# Patient Record
Sex: Female | Born: 1970 | Race: Black or African American | Hispanic: No | Smoking: Never smoker
Health system: Southern US, Community
[De-identification: ages and names within clinical notes are randomized; demographics above are authoritative.]

## PROBLEM LIST (undated history)

## (undated) DIAGNOSIS — B009 Herpesviral infection, unspecified: Secondary | ICD-10-CM

## (undated) DIAGNOSIS — K529 Noninfective gastroenteritis and colitis, unspecified: Secondary | ICD-10-CM

## (undated) DIAGNOSIS — D259 Leiomyoma of uterus, unspecified: Secondary | ICD-10-CM

## (undated) HISTORY — PX: WISDOM TOOTH EXTRACTION: SHX21

## (undated) HISTORY — PX: ABDOMINAL HYSTERECTOMY: SHX81

---

## 1999-11-16 ENCOUNTER — Other Ambulatory Visit: Admission: RE | Admit: 1999-11-16 | Discharge: 1999-11-16 | Payer: Self-pay | Admitting: *Deleted

## 2000-02-08 ENCOUNTER — Encounter: Payer: Self-pay | Admitting: Family Medicine

## 2000-02-08 ENCOUNTER — Encounter: Admission: RE | Admit: 2000-02-08 | Discharge: 2000-02-08 | Payer: Self-pay | Admitting: Family Medicine

## 2000-09-05 ENCOUNTER — Other Ambulatory Visit: Admission: RE | Admit: 2000-09-05 | Discharge: 2000-09-05 | Payer: Self-pay | Admitting: *Deleted

## 2001-06-26 ENCOUNTER — Other Ambulatory Visit: Admission: RE | Admit: 2001-06-26 | Discharge: 2001-06-26 | Payer: Self-pay | Admitting: Obstetrics & Gynecology

## 2001-08-17 ENCOUNTER — Emergency Department (HOSPITAL_COMMUNITY): Admission: EM | Admit: 2001-08-17 | Discharge: 2001-08-17 | Payer: Self-pay | Admitting: Emergency Medicine

## 2001-08-17 ENCOUNTER — Encounter: Payer: Self-pay | Admitting: Emergency Medicine

## 2001-10-11 ENCOUNTER — Encounter (INDEPENDENT_AMBULATORY_CARE_PROVIDER_SITE_OTHER): Payer: Self-pay | Admitting: Specialist

## 2001-10-11 ENCOUNTER — Encounter: Payer: Self-pay | Admitting: Obstetrics and Gynecology

## 2001-10-11 ENCOUNTER — Inpatient Hospital Stay (HOSPITAL_COMMUNITY): Admission: AD | Admit: 2001-10-11 | Discharge: 2001-10-15 | Payer: Self-pay | Admitting: Obstetrics & Gynecology

## 2001-10-12 ENCOUNTER — Encounter: Payer: Self-pay | Admitting: Obstetrics and Gynecology

## 2001-10-16 ENCOUNTER — Encounter: Admission: RE | Admit: 2001-10-16 | Discharge: 2001-11-15 | Payer: Self-pay | Admitting: Obstetrics and Gynecology

## 2001-11-13 ENCOUNTER — Other Ambulatory Visit: Admission: RE | Admit: 2001-11-13 | Discharge: 2001-11-13 | Payer: Self-pay | Admitting: Obstetrics & Gynecology

## 2001-11-16 ENCOUNTER — Encounter: Admission: RE | Admit: 2001-11-16 | Discharge: 2001-12-16 | Payer: Self-pay | Admitting: Obstetrics and Gynecology

## 2002-01-16 ENCOUNTER — Encounter: Admission: RE | Admit: 2002-01-16 | Discharge: 2002-02-15 | Payer: Self-pay | Admitting: Obstetrics and Gynecology

## 2002-03-18 ENCOUNTER — Encounter: Admission: RE | Admit: 2002-03-18 | Discharge: 2002-04-17 | Payer: Self-pay | Admitting: Obstetrics and Gynecology

## 2002-11-18 ENCOUNTER — Other Ambulatory Visit: Admission: RE | Admit: 2002-11-18 | Discharge: 2002-11-18 | Payer: Self-pay | Admitting: Obstetrics & Gynecology

## 2005-02-19 HISTORY — PX: ROBOT ASSISTED MYOMECTOMY: SHX5142

## 2005-05-22 ENCOUNTER — Ambulatory Visit (HOSPITAL_COMMUNITY): Admission: RE | Admit: 2005-05-22 | Discharge: 2005-05-23 | Payer: Self-pay | Admitting: Obstetrics & Gynecology

## 2005-05-22 ENCOUNTER — Encounter (INDEPENDENT_AMBULATORY_CARE_PROVIDER_SITE_OTHER): Payer: Self-pay | Admitting: *Deleted

## 2010-02-04 ENCOUNTER — Inpatient Hospital Stay (HOSPITAL_COMMUNITY)
Admission: AD | Admit: 2010-02-04 | Discharge: 2010-02-04 | Payer: Self-pay | Source: Home / Self Care | Attending: Obstetrics & Gynecology | Admitting: Obstetrics & Gynecology

## 2010-05-01 LAB — URINALYSIS, ROUTINE W REFLEX MICROSCOPIC
Glucose, UA: NEGATIVE mg/dL
Ketones, ur: NEGATIVE mg/dL
Leukocytes, UA: NEGATIVE
Nitrite: NEGATIVE
Protein, ur: NEGATIVE mg/dL
pH: 5.5 (ref 5.0–8.0)

## 2010-05-01 LAB — CBC
HCT: 39.1 % (ref 36.0–46.0)
Hemoglobin: 13.3 g/dL (ref 12.0–15.0)
MCH: 29.4 pg (ref 26.0–34.0)
MCHC: 34 g/dL (ref 30.0–36.0)
MCV: 86.5 fL (ref 78.0–100.0)
Platelets: 295 10*3/uL (ref 150–400)
RBC: 4.52 MIL/uL (ref 3.87–5.11)
RDW: 13.7 % (ref 11.5–15.5)
WBC: 13.1 10*3/uL — ABNORMAL HIGH (ref 4.0–10.5)

## 2010-05-01 LAB — SAMPLE TO BLOOD BANK

## 2010-05-01 LAB — POCT PREGNANCY, URINE: Preg Test, Ur: POSITIVE

## 2010-05-01 LAB — HCG, QUANTITATIVE, PREGNANCY: hCG, Beta Chain, Quant, S: 6861 m[IU]/mL — ABNORMAL HIGH (ref <5)

## 2010-05-01 LAB — URINE MICROSCOPIC-ADD ON

## 2010-07-07 NOTE — Discharge Summary (Signed)
NAME:  Cheryl Grant, Cheryl Grant NO.:  [id number]   MEDICAL RECORD NO.:  [id number]                   PATIENT TYPE:   LOCATION:                                       FACILITY:   PHYSICIAN:  Maxie Better, MD              DATE OF BIRTH:   DATE OF ADMISSION:  10/11/2001  DATE OF DISCHARGE:  10/15/2001                                 DISCHARGE SUMMARY   ADMISSION DIAGNOSES:  1. Fever.  2. Fibroid uterus.  3. Intrauterine gestation at 28 weeks.   DISCHARGE DIAGNOSES:  1. Preterm gestation delivered.  2. Chorioamnionitis.  3. Preterm premature rupture of membranes.  4. Fibroid uterus.  5. Postoperative anemia.  6. Group B Streptococcus urinary tract infection.  7. Status post cesarean section.   HISTORY OF PRESENT ILLNESS:  This is a 40 year old, G1, P0, single, African-  American female with an EDC of January 03, 2002, by ultrasound who presents  at 83 weeks' gestation with a fever of unclear etiology.  On presentation,  the patient reported the fever started the night previously and had been  intermittent.  She reported dry cough.  She denies any diarrhea, nausea or  vomiting.  No family member is ill.  She had not traveled recently.  She  denied any neck pain or urinary symptoms.  She also denied any back pain.  She had a question of possible leakage of fluid, however, the patient  attributed this to the use of MetroGel for the diagnosis of bacterial  vaginosis.  The patient has reported good fetal movements and she had no  upper respiratory symptoms or arthralgia.  She also denied any photophobia.  She does note some shaking chills.  There was no evidence of any rash or  vaginal bleeding.  The patient has been receiving her care through Dr.  Seymour Bars and to date, the pregnancy had been unremarkable with the exception  of an abnormal one-hour glucose challenge test.  She is known to have  multiple fibroids with the largest on the right measuring 9 x  8 x 7.9 cm on  the last study.   HOSPITAL COURSE:  On presentation, the patient had a temperature of 102 and  decreased to 98.2 with Tylenol.  Her pulse was between 103 and 125.  Her  blood pressure was 122/73.  Her exam was notable for tachycardia.  Breasts  were soft with no palpable mass.  Abdomen was gravid with a palpable mass in  the right lower quadrant with no palpable contractions and the uterus was  nontender.  Her exam showed some small lesions x2, questioned condylomatous  changes.  The vagina had a mucoid slightly yellow discharge for which wet-  prep was performed.  The cervix was rounded off, closed, 2 cm long, vertex -  3 and she was firm negative.  There was no palpable inguinal or  supraclavicular nodes and extremities had  no edema.  She was placed on a  monitor and her initial baseline fetal heart rate was  165-170 that decreased to 150s after the 1 g of Tylenol.  She was  contracting every two to four minutes.  Urinalysis showed 40 of ketones,  specific gravity of 1.025, no leukocytes.  Blood cultures x1 were done.  Urine culture was sent.  Group B Streptococcus culture was done.  Gonorrhea  and Chlamydia cultures were obtained.  The patient underwent a chest x-ray  to rule out an atypical pneumonia and that was negative.  An ultrasound was  performed that showed normal amniotic fluid index and estimated fetal weight  of 1104 g, vertex at 27-5/7 weeks' gestation.  Her wet-prep showed positive  clue cells.  CBC showed a white count of 26.8 with 94 polys and 3 lymphs,  hematocrit of 35.3.   The patient was admitted and she was initially given IV ampicillin and  planned to add gentamicin.  However, with a working diagnosis of possible  viral/atypical pneumonia and for better coverage, she was changed to  ceftriaxone and azithromycin.  An infectious disease consult was obtained  and subcutaneous terbutaline x1 with a plan for magnesium sulfate as needed.  The patient was  continued on monitoring and hydration intravenously was  planned.  Amniocentesis was deferred.  The patient was seen by Dr. Ninetta Lights  from infectious disease.  Additional lab studies were recommended by him and  performed including TSH, ANA, Amylase, complete metabolic profile and  consideration to repeat her HIV serology.  She was started on magnesium  sulfate for continued contractions with a plan to await antibiotics response  which had just been started.  An ICU consultation was obtained.  The  patient, despite her contractions, remained by cervical exam unchanged.  Due  to the fact of ongoing fever and contractions, discussion was had regarding  amniocentesis at that point.  The procedure was explained and ultrasound was  called in for an amniocentesis to rule out chorioamnionitis.  While getting  ready for the amniocentesis, the patient reported something running out of  her vagina.  She had a Foley in place.  Blood tinged fluid was noted.  She  was nitrazine positive and fluid was seen leaking from the vaginal area.  Ultrasound also now reported decreased amniotic fluid index which is  consistent with a diagnosis of preterm premature rupture of membranes.  Clindamycin and gentamicin were ordered and the patient was informed of the  need to proceed with cesarean section in light of her cervix remaining  closed and clearly the source of her fever appears to be due to intrauterine  infection.  The patient was transferred to the operating room where she  underwent the primary cesarean section via the Kronig incision.  This  resulted in the delivery of a live female, Apgar's of 1, 5 and 5.  The  placenta was sent to pathology.  Intrauterine anaerobic and aerobic cultures  were performed.  Cord pH was 7.26.  Normal tubes and ovaries are identified.  There was a 9 cm right fundal anterior subserosal fibroid.  Multiple greater than 3 cm intramural posterior fibroids were seen.  There were some   adhesions to that right large fibroid that were lysed.  The patient was  continued on her gentamicin and clindamycin postoperatively.  Recommendations from infectious disease was to consolidate on antibiotics  and therefore the patient was switched to Unasyn.  A repeat CBC had shown a  white count of 29.4, hemoglobin 11.6 and hematocrit 34.2 on October 12, 2001.  Blood culture was negative.  Urine culture showed Group B Streptococcus.  TSH was normal and her complete metabolic profile was notable for  hyperkalemia of 3.3 and glucose of 136.  The uterine cultures were also  positive for Group B Streptococcus.  The patient defervesced by postop day  #1 and remained afebrile throughout her postoperative course.  Her CBC on  postop day #1 showed a white count of 26.6, hemoglobin 10.5, hematocrit  30.5, platelet count of 265,000.  By postop day #3, the Unasyn was  discontinued.  The patient was started on Augmentin 875 mg p.o. b.i.d. for a  plan for a 10-day course.  She was tolerating a regular diet and has had  flatus.  Her incision showed no erythema, induration and she had moderate  lochia.  The patient desired to go home.   DISPOSITION:  Discharged to home with the baby staying in the NICU due to  prematurity.   CONDITION ON DISCHARGE:  Stable.   DISCHARGE MEDICATIONS:  1. Tylox #30 one p.o. q.4h. p.r.n. pain.  2. Augmentin 875 mg p.o. b.i.d. x10 days.  3. Prenatal vitamins one p.o. q.d.  4. Over-the-counter iron supplementation one p.o. b.i.d.   FOLLOW UP:  Follow-up appointment in one week with Dr. Seymour Bars.  Postpartum  visit in four weeks.    SPECIAL INSTRUCTIONS:  Call for temperature of 100.4 or greater, severe  abdominal pain, nausea, vomiting, increased incisional pain, redness or  drainage from the incision site.  Nothing per vagina x6 weeks.   ACTIVITY:  No heavy lifting or driving x2 weeks.                                               Maxie Better, MD     [place]/MEDQ  D:  12/07/2001  T:  12/08/2001  Job:  [id number]

## 2010-07-07 NOTE — Op Note (Signed)
Cheryl Grant, Cheryl Grant                         ACCOUNT NO.:  [id number]   MEDICAL RECORD NO.:  [id number]                   PATIENT TYPE:  INP   LOCATION:  9121                                 FACILITY:  [facility]   PHYSICIAN:  Sheronette A. Cherly Hensen, M.D.         DATE OF BIRTH:  [birth year]   DATE OF PROCEDURE:  10/11/2001  DATE OF DISCHARGE:                                 OPERATIVE REPORT   PREOPERATIVE DIAGNOSES:  1. Preterm premature rupture of membranes presumed chorioamnionitis.  2. Intrauterine gestation at 28-1/7 weeks.  3. Fibroid uterus.   POSTOPERATIVE DIAGNOSES:  1. Preterm premature rupture of membranes.  2. Chorioamnionitis.  3. Intrauterine gestation at 28-1/7 weeks.  4. Large subserosal and internal fibroids.   PROCEDURE:  Primary cesarean section, Kroneg incision.   SURGEON:  Sheronette A. Cherly Hensen, M.D.   ASSISTANT:  Pershing Cox, M.D.   INDICATIONS:  This is a 40 year old gravida 1, para 0 female at 28-1/7  weeks' gestation by early ultrasound who presented on October 10, 2001, with  temperature of 102 without any clearly defined source.  The patient had been  treated most recently for a bacterial vaginosis and has completely her  therapy on Thursday.  On presentation, the patient reported having had some  lower abdominal pain that had resolved.  She has had intermittent dry cough  but denied any nausea, vomiting or diarrhea.  She also had denied any  urinary symptoms and thought that some leakage of fluid was really her  MetroGel from the vagina.  The patient also had no upper respiratory other  concerns.  Evaluation included an obstetrical ultrasound which showed a  normal amniotic fluid and estimated fetal weight of approximately 1100 g.  Chest x-ray was negative.  Her CBC was notable for a white count of 26.8  with a left shift.  Urinalysis was negative.  Blood cultures and urine  cultures were sent.  An infectious disease consultation was obtained,  and  the patient who initially was ordered to have ampicillin and gentamicin  received only ampicillin intravenously, and her antibiotic regimen was  changed to ceftriaxone and azithromycin with the possibility that this may  be atypical pneumonia which may manifest itself as a result of rehydration  with the patient.  She was given Tylenol for her fever.  The patient on  initial presentation had a fetal tachycardia as well as contractions which  was attributed to her underlying infection of which the source was not  clearly defined.  She is known to have multiple uterine fibroids, however,  her exam was nontender, and the fibroids in her right lower quadrant could  be palpated.  The patient was also noted to be fern negative.  Her wet prep  was noted to have positive clue cells but otherwise unremarkable and she was  nitrazine negative as well.  Her cervix was closed on presentation.  The  patient was continued on the  antibiotic regimen based on the agreement along  with the infectious disease that at this point the possible source of her  fever and await the medications to work.  The patient received one dose of  subcutaneous terbutaline and had her contractions abated.  However, several  hours later, those contractions resumed, and magnesium sulfate was started  in order to not mask any other symptoms that needed to be seen as well as  the patient's maternal pulse.  The magnesium was ultimately increased to 2.5  g per hour but the contractions though spaced were still persistent.  Her  temperature respiked and ultimately after consideration what was left for  evaluation, amniocentesis was ordered.  Gentamicin was ordered to be started  and be added to her current regimen.  Fetal heart rate increased to the  190s.  Ultrasound came in for the amniocentesis.  Consent was obtained for  that procedure and in the process of starting to prep for the amniocentesis,  the patient complained of  leakage of fluid per vagina which was subsequently  noted to be Nitrazine positive and seen between her legs.  The fluid index  of 6 was lower than what was noted at the time the patient presented.  This  was confirmatory for preterm, premature rupture of membranes and in light of  the infectious setting along with her cervix remaining closed and the fetal  part high decision was made to proceed with primary cesarean section rather  than attempt to induce labor in this setting.  The risks of the procedure  was explained to the patient and the father of the baby including the  possible need for the removal of a fibroid in order to facilitate the  surgery.  Otherwise, the fibroids will remain.  She was also informed of the  possibility of the need for cesarean section in the future, infection,  bleeding, blood transfusion with the inherent possible risk of HIV  transmission 1/100,000, hepatitis 02/2998 and acute reaction just to the  products themselves.  Her significant other was informed that the directed  donation of blood was not a possibility in this circumstance.  The patient  was given gentamicin and clindamycin on route to the operating room.  An  indwelling Foley catheter was in place.   DESCRIPTION OF PROCEDURE:  The patient was transferred to the operating room  and after review and discussion, decision was made by anesthesiology to  proceed with a general anesthesia rather than a regional.  The patient was  noted to have continued leakage of fluid on the operating room table.  She  was subsequently placed in the supine position with a left lateral tilt.  She was thoroughly prepped and draped in the usual fashion and shortly after  induction of anesthesia, a Pfannenstiel skin incision was then made and  carried down to the rectus fascia using scalpel.  The rectus fascia was  incised transversely, the rectus muscles through the midline, the parietal peritoneum was entered bluntly  and extended.  On entering the abdominal  cavity, the uterus was noted to be exteriorly rotation, and palpation of the  right lower quadrant revealed a palpable fibroid with an otherwise narrow  lower uterine segment.  Decision was then made to perform a low vertical  incision in this setting which was then performed and subsequent delivery of  a live female infant was accomplished, bulb suction.  The cord was clamped,  cut and the baby was transferred to the awaiting  neonatal intensive care  team.  Weight of the baby was 2 pounds, 1.7 ounces.  Apgars were 1, 5 and 5  at one, five and 10 minutes.  The placenta was spontaneous, intact.  The  uterine cavity was cleaned of debris.  The placenta was sent to pathology.  Cord blood and cord pH was obtained.  The cord pH was 7.26.  The uterus was  exteriorized to further delineate the fibroids and on palpation of the  fundal area there were adhesions that were noted to be attached to the right  fundal fibroid approximately 9 cm.  The fibroid was removed.  It was  predominantly subserosal, almost pedunculated but the base was too blunt for  that to be considered as pedunculated.  There was a 3 cm pedunculated  fibroid adjacent to that.  Multiple intramural posterior fibroids were noted  at 3 cm or greater size.  Both ovaries and tubes are otherwise normal.  The  vertical incision was confined to the lower uterine segment, and the  incision was closed in two layers.  The first layer was a 0 Monocryl running  locked stitch.  The second layer was an imbricating using 0 Monocryl.  Small  bleeding along the edges were cauterized.  Good hemostasis was subsequently  noted.  The uterus was returned to the abdomen, and it was irrigated, and  suctioned of debris.  The vesicouterine peritoneum and the parietal  peritoneum were not closed.  The rectus fascia was inspected.  The rectus  muscles were also inspected.  Small bleeders were cauterized.  The rectus   fascia was closed with 0 Vicryl x2, and the skin was subsequently  approximated using Ethicon staples.  The specimen was placenta sent to  pathology.  Estimated blood loss was 300 cc.  Intraoperative fluid consisted  of 100 cc of crystalloid.  Urine output was 300 cc of clear yellow urine.  Sponge and instrument counts x2 were correct.  Complications were none.  The  patient tolerated the procedure well and was transferred to the recovery  room in stable condition.                                               Sheronette A. Cherly Hensen, M.D.    [facility]/MEDQ  D:  10/12/2001  T:  10/13/2001  Job:  [id number]

## 2010-07-07 NOTE — Op Note (Signed)
Cheryl Grant, Cheryl Grant               ACCOUNT NO.:  [id number]   MEDICAL RECORD NO.:  [id number]          PATIENT TYPE:  AMB   LOCATION:  DAY                          FACILITY:  [facility]   PHYSICIAN:  Genia Del, M.D.DATE OF BIRTH:  31-Oct-1970   DATE OF PROCEDURE:  05/22/2005  DATE OF DISCHARGE:                                 OPERATIVE REPORT   The patient came to [facility] on May 22, 2005.   PREOPERATIVE DIAGNOSIS:  Symptomatic multiple uterine myomas.   POSTOPERATIVE DIAGNOSIS:  Symptomatic multiple uterine myomas, plus omental  abdominal wall adhesions.   PROCEDURE:  Myomectomies by laparoscopy, assisted with da Vinci report.   SURGEON:  Genia Del, M.D.   ASSISTANT:  Pershing Cox, M.D.   Duard Brady, M.D.   ANESTHESIOLOGISTRica Mast, M.D.   PROCEDURE:  Under general anesthesia with endotracheal intubation, the  patient is lithotomy position for operative laparoscopy. She is prepped with  Betadine on the abdominal, suprapubic, vulva and vaginal areas and draped as  usual. The vaginal exam reveals an enlarged uterus, anteverted, measuring  about 13 cm, mobile. No adnexal mass. The cervix is normal to palpation. The  bladder catheter is inserted. We then put the weighted speculum in the  vagina. The anterior lip of the cervix is grasped with a tenaculum. Dilation  of the cervix is done with dilators up to #23. We then inserted the ZUMI  intrauterine cannula for manipulation of the uterus during this surgery. We  removed the weighted speculum. We returned to abdominal time. Measurements  are done for port entry. We then made an infiltration with Marcaine 0.25  plain about 3 cm above the umbilicus. We made an incision over 1 cm with a  scalpel. The Veress needle is inserted while raising the abdominal wall.  Security tests are done. A pneumoperitoneum is created with CO2. We then  removed the Veress needle. The 10/12 trocar is inserted at  that level. We  then inserted the camera and visualized the intra-abdominal cavity. We then  infiltrated at all incision site and made incisions with a scalpel for the  three robot arms that will be 8 mm and for the assistant port which will be  a 10/12 trocar. Once all trocars are inserted, we proceeded with docking  which is done easily. We then inserted all instruments, and at that point, I  unscrubbed and go to the console as Dr. Carey Bullocks stays at the patient. We  used a spinal needle with vasopressin and directed to the uterus to  infiltrate the myometrium along the line that will be opened for myomectomy.  We infiltrated 20 cc of diluted vasopressin. We then inspected the abdominal  cavity which is within normal. The pelvic cavity presents omental adhesions  with the uterus and the uterine myomas that are mainly fundal and to the  right. The two tubes and the two ovaries are normal to inspection. We used  the unipolar scissors, hot shears and the fenestrated bipolar and proceeded  with lysis of adhesions first. We then made an incision in the myometrium  using the monopolar scissors. This goes from the fundus to the posterior  aspect of the uterus over about 6 cm. We then enucleated the largest myoma  which is about 6 cm. Sections are made into it to proceed more easily to  full extraction of that myoma. We then removed a second slightly smaller  myoma through the same incision. That myoma measures about 3 cm. It is just  adjacent and deep to the first one. We have a third myoma that is removed a  little bit more towards the left aspect of the fundus which measures about 3  cm as well. A fourth myoma measuring 2 cm is removed from deeper  posteriorly, and a subserosal myoma measuring about 1.5 cm is also removed  posteriorly. Two small myomas less than 1 cm are also removed. Hemostasis is  adequate all along the procedure. The shape and size of the uterus is now  within normal. Small  fibroids less than 2 cm are present very low anteriorly  and will not be removed because of location. We then proceeded to closure of  the myometrium. We do it in three layers. The first layer is done with 0  Vicryl on a CT2 with figure-of-eight to reapproximate deeply. We then do a  second layer the same way with Vicryl 0 again, and finally a baseball stitch  suture with Vicryl 2-0 is done at the serosal. We then complete hemostasis  towards the right aspect of the uterus with two figure-of-eight sutures of  Vicryl 2-0. Hemostasis is adequate. We then used a morcellator to remove all  myomas that were extracted from the uterus. We then irrigated and suctioned  the abdominopelvic cavity. Tisseel is used to improve hemostasis further. We  then removed all instruments. Undocking is done. The CO2 is evacuated. We  closed the assistant port at the aponeurosis with a Vicryl 0 and the  supraumbilical port with a Vicryl 0 at the aponeurosis as well. We closed  the skin with subcuticular stitches of Vicryl 4-0 at those two incisions,  and the others are closed with just one stitch of Vicryl 4-0 and Dermabond.  The instruments vaginally are removed. The estimated blood loss was less  than 50 cc. No complication occurred, and the patient was brought to the  recovery room in good status.      Genia Del, M.D.  Electronically Signed     ML/MEDQ  D:  05/22/2005  T:  05/23/2005  Job:  [id number]

## 2010-09-29 ENCOUNTER — Emergency Department (HOSPITAL_COMMUNITY)
Admission: EM | Admit: 2010-09-29 | Discharge: 2010-09-29 | Disposition: A | Discharge status: Home / Self Care | Payer: Medicaid Other | Attending: Emergency Medicine | Admitting: Emergency Medicine

## 2010-09-29 ENCOUNTER — Emergency Department (HOSPITAL_COMMUNITY): Payer: Medicaid Other

## 2010-09-29 DIAGNOSIS — H539 Unspecified visual disturbance: Secondary | ICD-10-CM | POA: Insufficient documentation

## 2010-09-29 DIAGNOSIS — E860 Dehydration: Secondary | ICD-10-CM | POA: Insufficient documentation

## 2010-09-29 DIAGNOSIS — R42 Dizziness and giddiness: Secondary | ICD-10-CM | POA: Insufficient documentation

## 2010-09-29 LAB — POCT I-STAT, CHEM 8
BUN: 11 mg/dL (ref 6–23)
Calcium, Ion: 1.12 mmol/L (ref 1.12–1.32)
Chloride: 105 mEq/L (ref 96–112)
Creatinine, Ser: 0.8 mg/dL (ref 0.50–1.10)
Glucose, Bld: 80 mg/dL (ref 70–99)
Potassium: 3.9 mEq/L (ref 3.5–5.1)

## 2010-09-29 LAB — URINALYSIS, ROUTINE W REFLEX MICROSCOPIC
Bilirubin Urine: NEGATIVE
Glucose, UA: NEGATIVE mg/dL
Protein, ur: NEGATIVE mg/dL

## 2010-09-29 LAB — CBC
HCT: 38.7 % (ref 36.0–46.0)
MCH: 29.9 pg (ref 26.0–34.0)
MCHC: 34.1 g/dL (ref 30.0–36.0)
MCV: 87.8 fL (ref 78.0–100.0)
Platelets: 292 10*3/uL (ref 150–400)
RDW: 13.6 % (ref 11.5–15.5)
WBC: 14.3 10*3/uL — ABNORMAL HIGH (ref 4.0–10.5)

## 2010-09-29 LAB — DIFFERENTIAL
Eosinophils Absolute: 0.6 10*3/uL (ref 0.0–0.7)
Eosinophils Relative: 4 % (ref 0–5)
Lymphocytes Relative: 27 % (ref 12–46)
Lymphs Abs: 3.8 10*3/uL (ref 0.7–4.0)
Monocytes Absolute: 0.7 10*3/uL (ref 0.1–1.0)
Monocytes Relative: 5 % (ref 3–12)

## 2010-09-29 LAB — URINE MICROSCOPIC-ADD ON

## 2014-01-06 ENCOUNTER — Ambulatory Visit: Payer: Medicaid Other

## 2014-02-01 ENCOUNTER — Ambulatory Visit: Payer: Medicaid Other

## 2015-05-13 ENCOUNTER — Inpatient Hospital Stay (HOSPITAL_COMMUNITY)
Admission: EM | Admit: 2015-05-13 | Discharge: 2015-05-18 | DRG: 871 | Disposition: A | Discharge status: Home / Self Care | Payer: Self-pay | Attending: Internal Medicine | Admitting: Internal Medicine

## 2015-05-13 DIAGNOSIS — N920 Excessive and frequent menstruation with regular cycle: Secondary | ICD-10-CM | POA: Diagnosis present

## 2015-05-13 DIAGNOSIS — D259 Leiomyoma of uterus, unspecified: Secondary | ICD-10-CM | POA: Diagnosis present

## 2015-05-13 DIAGNOSIS — K5909 Other constipation: Secondary | ICD-10-CM

## 2015-05-13 DIAGNOSIS — K651 Peritoneal abscess: Secondary | ICD-10-CM | POA: Diagnosis present

## 2015-05-13 DIAGNOSIS — K529 Noninfective gastroenteritis and colitis, unspecified: Secondary | ICD-10-CM | POA: Diagnosis present

## 2015-05-13 DIAGNOSIS — A419 Sepsis, unspecified organism: Principal | ICD-10-CM | POA: Diagnosis present

## 2015-05-13 DIAGNOSIS — E876 Hypokalemia: Secondary | ICD-10-CM | POA: Diagnosis present

## 2015-05-13 DIAGNOSIS — R160 Hepatomegaly, not elsewhere classified: Secondary | ICD-10-CM | POA: Diagnosis present

## 2015-05-13 DIAGNOSIS — E46 Unspecified protein-calorie malnutrition: Secondary | ICD-10-CM

## 2015-05-13 DIAGNOSIS — L0291 Cutaneous abscess, unspecified: Secondary | ICD-10-CM

## 2015-05-13 DIAGNOSIS — Z886 Allergy status to analgesic agent status: Secondary | ICD-10-CM

## 2015-05-13 DIAGNOSIS — K567 Ileus, unspecified: Secondary | ICD-10-CM | POA: Diagnosis present

## 2015-05-13 DIAGNOSIS — R188 Other ascites: Secondary | ICD-10-CM | POA: Diagnosis present

## 2015-05-13 DIAGNOSIS — N739 Female pelvic inflammatory disease, unspecified: Secondary | ICD-10-CM

## 2015-05-13 HISTORY — DX: Leiomyoma of uterus, unspecified: D25.9

## 2015-05-13 NOTE — ED Notes (Signed)
Pt. Came in with complaint of abdominal pain at 6/10 which started at 6am today, pt. Stated "I must have abdominal obstruction because i took 3 diff stool softeners today but i just had that little rock like stool around 3pm" . Nauseous earlier and vomited twice but denies upon arrival to ED. Pt. Stated that she cant keep food nor fluid down .

## 2015-05-14 ENCOUNTER — Encounter (HOSPITAL_COMMUNITY): Payer: Self-pay | Admitting: Emergency Medicine

## 2015-05-14 ENCOUNTER — Emergency Department (HOSPITAL_COMMUNITY): Payer: Self-pay

## 2015-05-14 DIAGNOSIS — K529 Noninfective gastroenteritis and colitis, unspecified: Secondary | ICD-10-CM | POA: Diagnosis present

## 2015-05-14 DIAGNOSIS — K5909 Other constipation: Secondary | ICD-10-CM

## 2015-05-14 DIAGNOSIS — D259 Leiomyoma of uterus, unspecified: Secondary | ICD-10-CM | POA: Diagnosis present

## 2015-05-14 DIAGNOSIS — E876 Hypokalemia: Secondary | ICD-10-CM | POA: Diagnosis present

## 2015-05-14 DIAGNOSIS — R188 Other ascites: Secondary | ICD-10-CM | POA: Diagnosis present

## 2015-05-14 DIAGNOSIS — R160 Hepatomegaly, not elsewhere classified: Secondary | ICD-10-CM | POA: Diagnosis present

## 2015-05-14 LAB — CBC WITH DIFFERENTIAL/PLATELET
BASOS PCT: 0 %
Basophils Absolute: 0 10*3/uL (ref 0.0–0.1)
EOS ABS: 0 10*3/uL (ref 0.0–0.7)
EOS PCT: 0 %
HEMATOCRIT: 34.2 % — AB (ref 36.0–46.0)
Hemoglobin: 11.4 g/dL — ABNORMAL LOW (ref 12.0–15.0)
LYMPHS ABS: 1.2 10*3/uL (ref 0.7–4.0)
Lymphocytes Relative: 4 %
MCH: 28.7 pg (ref 26.0–34.0)
MCHC: 33.3 g/dL (ref 30.0–36.0)
MCV: 86.1 fL (ref 78.0–100.0)
MONO ABS: 0.9 10*3/uL (ref 0.1–1.0)
Monocytes Relative: 3 %
NEUTROS PCT: 93 %
Neutro Abs: 27.2 10*3/uL — ABNORMAL HIGH (ref 1.7–7.7)
PLATELETS: 484 10*3/uL — AB (ref 150–400)
RBC: 3.97 MIL/uL (ref 3.87–5.11)
RDW: 13.5 % (ref 11.5–15.5)
WBC Morphology: INCREASED
WBC: 29.3 10*3/uL — AB (ref 4.0–10.5)

## 2015-05-14 LAB — URINALYSIS, ROUTINE W REFLEX MICROSCOPIC
BILIRUBIN URINE: NEGATIVE
Glucose, UA: NEGATIVE mg/dL
KETONES UR: NEGATIVE mg/dL
Leukocytes, UA: NEGATIVE
NITRITE: NEGATIVE
PH: 5.5 (ref 5.0–8.0)
Protein, ur: NEGATIVE mg/dL
SPECIFIC GRAVITY, URINE: 1.021 (ref 1.005–1.030)

## 2015-05-14 LAB — COMPREHENSIVE METABOLIC PANEL
ALK PHOS: 69 U/L (ref 38–126)
ALT: 10 U/L — AB (ref 14–54)
AST: 16 U/L (ref 15–41)
Albumin: 3.4 g/dL — ABNORMAL LOW (ref 3.5–5.0)
Anion gap: 9 (ref 5–15)
BUN: 7 mg/dL (ref 6–20)
CALCIUM: 8.7 mg/dL — AB (ref 8.9–10.3)
CO2: 26 mmol/L (ref 22–32)
CREATININE: 0.69 mg/dL (ref 0.44–1.00)
Chloride: 102 mmol/L (ref 101–111)
Glucose, Bld: 138 mg/dL — ABNORMAL HIGH (ref 65–99)
Potassium: 3.3 mmol/L — ABNORMAL LOW (ref 3.5–5.1)
Sodium: 137 mmol/L (ref 135–145)
Total Bilirubin: 1.1 mg/dL (ref 0.3–1.2)
Total Protein: 7.7 g/dL (ref 6.5–8.1)

## 2015-05-14 LAB — HCG, QUANTITATIVE, PREGNANCY: hCG, Beta Chain, Quant, S: <1 m[IU]/mL (ref <5)

## 2015-05-14 LAB — URINE MICROSCOPIC-ADD ON

## 2015-05-14 LAB — LACTIC ACID, PLASMA
Lactic Acid, Venous: 2.2 mmol/L (ref 0.5–2.0)
Lactic Acid, Venous: 1.4 mmol/L (ref 0.5–2.0)

## 2015-05-14 MED ORDER — HYDROMORPHONE HCL 1 MG/ML IJ SOLN: 0.5000 mg | INTRAMUSCULAR | Status: DC | PRN

## 2015-05-14 MED ORDER — IOHEXOL 300 MG/ML  SOLN
50.0000 mL | Freq: Once | INTRAMUSCULAR | Status: AC | PRN
  Administered 2015-05-14: 50 mL via ORAL

## 2015-05-14 MED ORDER — LIP MEDEX EX OINT
1.0000 "application " | TOPICAL_OINTMENT | Freq: Two times a day (BID) | CUTANEOUS | Status: DC
  Administered 2015-05-14 – 2015-05-18 (×4): 1 via TOPICAL
  Filled 2015-05-14: qty 7

## 2015-05-14 MED ORDER — METOPROLOL TARTRATE 1 MG/ML IV SOLN: 5.0000 mg | Freq: Four times a day (QID) | INTRAVENOUS | Status: DC | PRN

## 2015-05-14 MED ORDER — ONDANSETRON 4 MG PO TBDP: 4.0000 mg | ORAL_TABLET | Freq: Four times a day (QID) | ORAL | Status: DC | PRN

## 2015-05-14 MED ORDER — PIPERACILLIN-TAZOBACTAM 3.375 G IVPB 30 MIN: 3.3750 g | Freq: Three times a day (TID) | INTRAVENOUS | Status: DC

## 2015-05-14 MED ORDER — ACETAMINOPHEN 650 MG RE SUPP: 650.0000 mg | Freq: Four times a day (QID) | RECTAL | Status: DC | PRN

## 2015-05-14 MED ORDER — SODIUM CHLORIDE 0.9 % IV BOLUS (SEPSIS)
1000.0000 mL | Freq: Once | INTRAVENOUS | Status: AC
  Administered 2015-05-14: 1000 mL via INTRAVENOUS

## 2015-05-14 MED ORDER — POTASSIUM CHLORIDE 10 MEQ/100ML IV SOLN
10.0000 meq | INTRAVENOUS | Status: DC
  Administered 2015-05-14 (×3): 10 meq via INTRAVENOUS
  Filled 2015-05-14 (×3): qty 100

## 2015-05-14 MED ORDER — BISACODYL 10 MG RE SUPP: 10.0000 mg | Freq: Every day | RECTAL | Status: DC

## 2015-05-14 MED ORDER — HYDROMORPHONE HCL 1 MG/ML IJ SOLN
1.0000 mg | INTRAMUSCULAR | Status: DC | PRN
  Administered 2015-05-14 – 2015-05-17 (×3): 1 mg via INTRAVENOUS
  Filled 2015-05-14 (×3): qty 1

## 2015-05-14 MED ORDER — MENTHOL 3 MG MT LOZG: 1.0000 | LOZENGE | OROMUCOSAL | Status: DC | PRN

## 2015-05-14 MED ORDER — PANTOPRAZOLE SODIUM 40 MG IV SOLR
40.0000 mg | INTRAVENOUS | Status: DC
  Administered 2015-05-14 – 2015-05-17 (×4): 40 mg via INTRAVENOUS
  Filled 2015-05-14 (×5): qty 40

## 2015-05-14 MED ORDER — METHOCARBAMOL 1000 MG/10ML IJ SOLN
1000.0000 mg | Freq: Four times a day (QID) | INTRAVENOUS | Status: DC | PRN
  Filled 2015-05-14: qty 10

## 2015-05-14 MED ORDER — CEFTRIAXONE SODIUM 2 G IJ SOLR
2.0000 g | INTRAMUSCULAR | Status: DC
  Administered 2015-05-14 – 2015-05-18 (×5): 2 g via INTRAVENOUS
  Filled 2015-05-14 (×6): qty 2

## 2015-05-14 MED ORDER — ACETAMINOPHEN 325 MG PO TABS: 325.0000 mg | ORAL_TABLET | Freq: Four times a day (QID) | ORAL | Status: DC | PRN

## 2015-05-14 MED ORDER — PIPERACILLIN-TAZOBACTAM 3.375 G IVPB 30 MIN
3.3750 g | Freq: Once | INTRAVENOUS | Status: AC
  Administered 2015-05-14: 3.375 g via INTRAVENOUS
  Filled 2015-05-14: qty 50

## 2015-05-14 MED ORDER — ONDANSETRON HCL 4 MG/2ML IJ SOLN: 4.0000 mg | Freq: Four times a day (QID) | INTRAMUSCULAR | Status: DC | PRN

## 2015-05-14 MED ORDER — IOPAMIDOL (ISOVUE-300) INJECTION 61%
100.0000 mL | Freq: Once | INTRAVENOUS | Status: AC | PRN
  Administered 2015-05-14: 100 mL via INTRAVENOUS

## 2015-05-14 MED ORDER — METRONIDAZOLE IN NACL 5-0.79 MG/ML-% IV SOLN
500.0000 mg | Freq: Four times a day (QID) | INTRAVENOUS | Status: DC
  Administered 2015-05-14 – 2015-05-18 (×16): 500 mg via INTRAVENOUS
  Filled 2015-05-14 (×19): qty 100

## 2015-05-14 MED ORDER — POTASSIUM CHLORIDE CRYS ER 20 MEQ PO TBCR
20.0000 meq | EXTENDED_RELEASE_TABLET | Freq: Once | ORAL | Status: AC
  Administered 2015-05-14: 20 meq via ORAL
  Filled 2015-05-14: qty 1

## 2015-05-14 MED ORDER — ONDANSETRON HCL 4 MG/2ML IJ SOLN
4.0000 mg | Freq: Once | INTRAMUSCULAR | Status: AC
  Administered 2015-05-14: 4 mg via INTRAVENOUS
  Filled 2015-05-14: qty 2

## 2015-05-14 MED ORDER — MAGIC MOUTHWASH
15.0000 mL | Freq: Four times a day (QID) | ORAL | Status: DC | PRN
  Filled 2015-05-14: qty 15

## 2015-05-14 MED ORDER — HYDROMORPHONE HCL 1 MG/ML IJ SOLN
1.0000 mg | Freq: Once | INTRAMUSCULAR | Status: AC
  Administered 2015-05-14: 1 mg via INTRAVENOUS
  Filled 2015-05-14: qty 1

## 2015-05-14 MED ORDER — DIPHENHYDRAMINE HCL 50 MG/ML IJ SOLN: 12.5000 mg | Freq: Four times a day (QID) | INTRAMUSCULAR | Status: DC | PRN

## 2015-05-14 MED ORDER — PIPERACILLIN-TAZOBACTAM 3.375 G IVPB
3.3750 g | Freq: Three times a day (TID) | INTRAVENOUS | Status: DC
  Administered 2015-05-14 – 2015-05-15 (×4): 3.375 g via INTRAVENOUS
  Filled 2015-05-14 (×5): qty 50

## 2015-05-14 MED ORDER — ALUM & MAG HYDROXIDE-SIMETH 200-200-20 MG/5ML PO SUSP: 30.0000 mL | Freq: Four times a day (QID) | ORAL | Status: DC | PRN

## 2015-05-14 MED ORDER — LACTATED RINGERS IV BOLUS (SEPSIS): 1000.0000 mL | Freq: Three times a day (TID) | INTRAVENOUS | Status: DC | PRN

## 2015-05-14 MED ORDER — POTASSIUM CHLORIDE 10 MEQ/100ML IV SOLN
10.0000 meq | INTRAVENOUS | Status: AC
  Administered 2015-05-14: 10 meq via INTRAVENOUS
  Filled 2015-05-14 (×2): qty 100

## 2015-05-14 MED ORDER — LACTATED RINGERS IV BOLUS (SEPSIS)
1000.0000 mL | Freq: Once | INTRAVENOUS | Status: AC
  Administered 2015-05-14: 1000 mL via INTRAVENOUS

## 2015-05-14 MED ORDER — PHENOL 1.4 % MT LIQD: 2.0000 | OROMUCOSAL | Status: DC | PRN

## 2015-05-14 MED ORDER — POTASSIUM CHLORIDE IN NACL 20-0.9 MEQ/L-% IV SOLN
INTRAVENOUS | Status: DC
  Administered 2015-05-14 – 2015-05-17 (×4): via INTRAVENOUS
  Filled 2015-05-14 (×8): qty 1000

## 2015-05-14 MED ORDER — PROMETHAZINE HCL 25 MG/ML IJ SOLN: 6.2500 mg | INTRAMUSCULAR | Status: DC | PRN

## 2015-05-14 NOTE — Consult Note (Signed)
[facility]  [address] [place], [place] [address] Phone: [phone] FAX: [place]  1970/10/20 [id number]  CARE TEAM:  PCP: No primary care provider on file.  Outpatient Care Team: Patient Care Team: Princess Bruins, MD as Consulting Physician (Obstetrics and Gynecology)  Inpatient Treatment Team: Treatment Team: Attending Provider: Virgel Manifold, MD; Registered Nurse: Justice Rocher, RN; Registered Nurse: Roosvelt Harps, RN; Consulting Physician: Nolon Nations, MD  This patient is a 45 y.o.female who presents today for surgical evaluation at the request of Dr Wilson Singer.   Reason for evaluation: Colitis & pelvic fluid  Young female with history of uterine fibroids and chronic constipation.  Noticed sudden intense abdominal pain with some constipation and obstipation.  Felt worsening discomfort.   She took laxatives the trigone enema with very little result.  Intensified.  Some nausea & vomiting.  Once emergency room.  A little better with pain medications.  Drank contrast and Most down.  Through potassium pill.  Start passing some gas.  Exam concerning for abdominal mass consistent with large fibroid uterus.  Some left-sided abdominal discomfort.  No guarding or acute abdomen.  CT scan showed thickened area of rectosigmoid colon in the absence of diverticulosis or diverticulitis.  Fluid concerning for possible abscess.  Surgical consultation requested.  No personal nor family history of GI/colon cancer, inflammatory bowel disease, irritable bowel syndrome, allergy such as Celiac Sprue, dietary/dairy problems, colitis, ulcers nor gastritis.  No recent sick contacts/gastroenteritis.  No travel outside the country.  No changes in diet.  No dysphagia to solids or liquids.  No significant heartburn or reflux.  No hematochezia, hematemesis, coffee ground emesis.  No evidence of prior gastric/peptic ulceration.   Normally walks her dogs several miles many times a week.  Does not smoke.      Past Medical History  Diagnosis Date  . Uterine fibroid     Past Surgical History  Procedure Laterality Date  . Cesarean section  2003    Primary cesarean section, Kroneg incision  . Robot assisted myomectomy  2007    Dr Dellis Filbert    Social History   Social History  . Marital Status: Single    Spouse Name: N/A  . Number of Children: N/A  . Years of Education: N/A   Occupational History  . Not on file.   Social History Main Topics  . Smoking status: Never Smoker   . Smokeless tobacco: Not on file  . Alcohol Use: Yes     Comment: ocassional  . Drug Use: No  . Sexual Activity: Not on file   Other Topics Concern  . Not on file   Social History Narrative  . No narrative on file    History reviewed. No pertinent family history.  Current Facility-Administered Medications  Medication Dose Route Frequency Provider Last Rate Last Dose  . lactated ringers bolus 1,000 mL  1,000 mL Intravenous Once Michael Boston, MD      . lactated ringers bolus 1,000 mL  1,000 mL Intravenous Q8H PRN Michael Boston, MD      . piperacillin-tazobactam (ZOSYN) IVPB 3.375 g  3.375 g Intravenous Once Virgel Manifold, MD      . potassium chloride 10 mEq in 100 mL IVPB  10 mEq Intravenous Q1 Hr x 4 Michael Boston, MD      . potassium chloride SA (K-DUR,KLOR-CON) CR tablet 20 mEq  20 mEq Oral Once Virgel Manifold, MD  No current outpatient prescriptions on file.     Allergies  Allergen Reactions  . Ibuprofen Hives    ROS: Constitutional:  +fevers, chills.  No sweats.  Weight stable Eyes:  No vision changes, No discharge HENT:  No sore throats, nasal drainage Lymph: No neck swelling, No bruising easily Pulmonary:  No cough, productive sputum CV: No orthopnea, PND  Patient walks 60 minutes for about 2 miles without difficulty.  No exertional chest/neck/shoulder/arm pain. GI:  No personal nor family history of  GI/colon cancer, inflammatory bowel disease, irritable bowel syndrome, allergy such as Celiac Sprue, dietary/dairy problems, colitis, ulcers nor gastritis.  No recent sick contacts/gastroenteritis.  No travel outside the country.  No changes in diet. Renal: No UTIs, No hematuria Genital:  No drainage, bleeding, masses Musculoskeletal: No severe joint pain.  Good ROM major joints.  Lumbar back pain Skin:  No sores or lesions.  No rashes Heme/Lymph:  No easy bleeding.  No swollen lymph nodes Neuro: No focal weakness/numbness.  No seizures Psych: No suicidal ideation.  No hallucinations  BP 110/70 mmHg  Pulse 98  Temp(Src) 98.4 F (36.9 C) (Oral)  Resp 20  SpO2 96%  LMP 05/04/2015  Physical Exam: General: Pt awake/alert/oriented x4 in no major acute distress.  Thin but not frankly cachectic.  Looks mildly sickly but not toxic. Eyes: PERRL, normal EOM. Sclera nonicteric Neuro: CN II-XII intact w/o focal sensory/motor deficits. Lymph: No head/neck/groin lymphadenopathy Psych:  No delerium/psychosis/paranoia HENT: Normocephalic, Mucus membranes moist.  No thrush Neck: Supple, No tracheal deviation Chest: No pain.  Good respiratory excursion. CV:  Pulses intact.  Regular rhythm Abdomen: Soft, Moderately Nondistended.  Lower abdominal mass above bellybutton consistent with large fibroid uterus.  Mild discomfort left lower quadrant but no peritonitis or guarding.  No hernias. Gen;  NEFG.  No vaginal bleeding or discharge.  No inguinal hernias. Ext:  SCDs BLE.  No significant edema.  No cyanosis Skin: No petechiae / purpurea.  No major sores Musculoskeletal: No severe joint pain.  Good ROM major joints   Results:   Labs: Results for orders placed or performed during the hospital encounter of 05/13/15 (from the past 48 hour(s))  Urinalysis, Routine w reflex microscopic (not at [facility])     Status: Abnormal   Collection Time: 05/14/15 12:12 AM  Result Value Ref Range   Color, Urine AMBER  (A) YELLOW    Comment: BIOCHEMICALS MAY BE AFFECTED BY COLOR   APPearance CLOUDY (A) CLEAR   Specific Gravity, Urine 1.021 1.005 - 1.030   pH 5.5 5.0 - 8.0   Glucose, UA NEGATIVE NEGATIVE mg/dL   Hgb urine dipstick SMALL (A) NEGATIVE   Bilirubin Urine NEGATIVE NEGATIVE   Ketones, ur NEGATIVE NEGATIVE mg/dL   Protein, ur NEGATIVE NEGATIVE mg/dL   Nitrite NEGATIVE NEGATIVE   Leukocytes, UA NEGATIVE NEGATIVE  Urine microscopic-add on     Status: Abnormal   Collection Time: 05/14/15 12:12 AM  Result Value Ref Range   Squamous Epithelial / LPF 0-5 (A) NONE SEEN   WBC, UA 0-5 0 - 5 WBC/hpf   RBC / HPF 6-30 0 - 5 RBC/hpf   Bacteria, UA FEW (A) NONE SEEN  Comprehensive metabolic panel     Status: Abnormal   Collection Time: 05/14/15 12:35 AM  Result Value Ref Range   Sodium 137 135 - 145 mmol/L   Potassium 3.3 (L) 3.5 - 5.1 mmol/L   Chloride 102 101 - 111 mmol/L   CO2 26 22 - 32 mmol/L  Glucose, Bld 138 (H) 65 - 99 mg/dL   BUN 7 6 - 20 mg/dL   Creatinine, Ser 0.69 0.44 - 1.00 mg/dL   Calcium 8.7 (L) 8.9 - 10.3 mg/dL   Total Protein 7.7 6.5 - 8.1 g/dL   Albumin 3.4 (L) 3.5 - 5.0 g/dL   AST 16 15 - 41 U/L   ALT 10 (L) 14 - 54 U/L   Alkaline Phosphatase 69 38 - 126 U/L   Total Bilirubin 1.1 0.3 - 1.2 mg/dL   GFR calc non Af Amer >60 >60 mL/min   GFR calc Af Amer >60 >60 mL/min    Comment: (NOTE) The eGFR has been calculated using the CKD EPI equation. This calculation has not been validated in all clinical situations. eGFR's persistently <60 mL/min signify possible Chronic Kidney Disease.    Anion gap 9 5 - 15  CBC WITH DIFFERENTIAL     Status: Abnormal   Collection Time: 05/14/15 12:35 AM  Result Value Ref Range   WBC 29.3 (H) 4.0 - 10.5 K/uL   RBC 3.97 3.87 - 5.11 MIL/uL   Hemoglobin 11.4 (L) 12.0 - 15.0 g/dL   HCT 34.2 (L) 36.0 - 46.0 %   MCV 86.1 78.0 - 100.0 fL   MCH 28.7 26.0 - 34.0 pg   MCHC 33.3 30.0 - 36.0 g/dL   RDW 13.5 11.5 - 15.5 %   Platelets 484 (H) 150 -  400 K/uL   Neutrophils Relative % 93 %   Lymphocytes Relative 4 %   Monocytes Relative 3 %   Eosinophils Relative 0 %   Basophils Relative 0 %   Neutro Abs 27.2 (H) 1.7 - 7.7 K/uL   Lymphs Abs 1.2 0.7 - 4.0 K/uL   Monocytes Absolute 0.9 0.1 - 1.0 K/uL   Eosinophils Absolute 0.0 0.0 - 0.7 K/uL   Basophils Absolute 0.0 0.0 - 0.1 K/uL   RBC Morphology POLYCHROMASIA PRESENT    WBC Morphology INCREASED BANDS (>20% BANDS)   hCG, quantitative, pregnancy     Status: None   Collection Time: 05/14/15 12:35 AM  Result Value Ref Range   hCG, Beta Chain, Quant, S <1 <5 mIU/mL    Comment:          GEST. AGE      CONC.  (mIU/mL)   <=1 WEEK        5 - 50     2 WEEKS       50 - 500     3 WEEKS       100 - 10,000     4 WEEKS     1,000 - 30,000     5 WEEKS     3,500 - 115,000   6-8 WEEKS     12,000 - 270,000    12 WEEKS     15,000 - 220,000        FEMALE AND NON-PREGNANT FEMALE:     LESS THAN 5 mIU/mL     Imaging / Studies: Ct Abdomen Pelvis W Contrast  05/14/2015  CLINICAL DATA:  45 year old female with abdominal pain standard in the left lower quadrant. Abdominal cramping and leukocytosis. EXAM: CT ABDOMEN AND PELVIS WITH CONTRAST TECHNIQUE: Multidetector CT imaging of the abdomen and pelvis was performed using the standard protocol following bolus administration of intravenous contrast. CONTRAST:  26m OMNIPAQUE IOHEXOL 300 MG/ML SOLN, 1067mISOVUE-300 IOPAMIDOL (ISOVUE-300) INJECTION 61% COMPARISON:  None FINDINGS: The visualized lung bases are clear. No intra-abdominal free air. Trace free fluid  within the pelvis. There multiple enhancing liver lesions. The largest lesion is located in the right lobe of the liver and measures approximately 5.0 x 4.0 cm and appears to have a central scar. Differentials include hemangioma, adenoma, focal nodular hyperplasia. Hepatocellular carcinoma or metastatic disease are not excluded. MRI without and with contrast is recommended for further characterization.  There is a 2.3 x 1.4 cm hypodense lesion in the left lobe of the liver (segment IV B), possibly a cyst. Stop the gallbladder, pancreas, spleen, adrenal glands all, kidneys, visualized ureters, and urinary bladder appear unremarkable. The uterus is enlarged and myomatous. There is a 2.5 x 1.5 cm cystic structure in the region of the right ovary, likely a dominant follicle/cyst. There is a 2.5 x 4.6 cm loculated fluid collection in the cul-de-sac (pouch of [place]) most compatible with an abscess. There is apparent segmental thickening of the rectosigmoid wall with haziness and enhancement of the mucosa concerning for colitis. Loose stool noted within the colon compatible with diarrheal state. There is no evidence of bowel obstruction. Normal appendix. The abdominal aorta and IVC appear unremarkable. No portal venous gas identified. There is no adenopathy. The abdominal wall soft tissues appear unremarkable. The osseous structures are intact. IMPRESSION: Diarrheal state with findings most compatible with colitis of the rectosigmoid. A small loculated fluid collection/abscess noted in the retrouterine space. Multiple enhancing hepatic lesions as described. Nonemergent MRI without and with contrast is recommended for further characterization. Enlarged and myomatous uterus. Right ovarian probable dominant follicle/cyst. Electronically Signed   By: Anner Crete M.D.   On: 05/14/2015 03:29    Medications / Allergies: per chart  Antibiotics: Anti-infectives    Start     Dose/Rate Route Frequency Ordered Stop   05/14/15 0345  piperacillin-tazobactam (ZOSYN) IVPB 3.375 g     3.375 g 100 mL/hr over 30 Minutes Intravenous  Once 05/14/15 7408        Assessment  Cheryl Grant  45 y.o. female       Problem List:  Principal Problem:   Colitis Active Problems:   Liver masses   Pelvic fluid collection   Fibroid uterus   Hypokalemia   Constipation, chronic   Obstipation with rectosigmoid thickening  and fluid concerning for colitis.  Chronic suspension with giant fibroid uterus.  Liver masses of uncertain etiology.  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  IV antibiotics.  Ceftriaxone and metronidazole.  Agree with MRI of the abdomen to evaluate liver masses.  Follow clinical course.  Consider drain placement by interventional radiology.  Perhaps consider repeating CT scan in 3-7 days to see if fluid collection improved.    Correct hypokalemia.  I ordered IV replacement since she vomited oral pill.  May benefit from colonoscopy to evaluate the colon.  No evidence of perforation.  Rule out inflammatory bowel disease or other cause.  Giant fibroid uterus.  My instinct she could benefit from hysterectomy as I think is causing constipation and other issues.  Nonemergent issue though.  Consider reevaluation with gynecology.  -VTE prophylaxis- SCDs, etc  -mobilize as tolerated to help recovery    Adin Hector, M.D., F.A.C.S. Gastrointestinal and Minimally Invasive Surgery [facility] [address] [place], [place] [address] [phone] Main / Paging   05/14/2015  Note: Portions of this report may have been transcribed using voice recognition software. Every effort was made to ensure accuracy; however, inadvertent computerized transcription errors may  be present.   Any transcriptional errors that result from this process are unintentional.

## 2015-05-14 NOTE — ED Provider Notes (Signed)
CSN: [id number]     Arrival date & time 05/13/15  2203 History  By signing my name below, I, [facility], attest that this documentation has been prepared under the direction and in the presence of Virgel Manifold, MD. Electronically Signed: Virgel Bouquet, ED Scribe. 05/14/2015. 1:43 AM.   No chief complaint on file.  The history is provided by the patient. No language interpreter was used.   HPI Comments: Cheryl Grant is a 45 y.o. female with an hx of who presents to the Emergency Department complaining of intermittent, moderate, cramping abdominal pain onset yesterday morning. Patient reports that she feels as if a rock is stuck in her side and feels it moving inside of her, followed by a small BM productive of hard stool earlier today. She endorses associated nausea, emesis productive of water that she had just drank, constipation, fevers TMAX 101, back pain worse on the right. She states that she has not been drinking much water and had a normal BM yesterday. Pain is She has drank cold water and taken laxatives without relief and used an enema with slight relief. She has an hx of a ceasarian section and fibroid removal. Denies similar symptoms in the past. Denies hx of abdominal surgeries. Denies hematochezia, melena currently, and any other symptoms.  History reviewed. No pertinent past medical history. Past Surgical History  Procedure Laterality Date  . Ceasarian section     History reviewed. No pertinent family history. Social History  Substance Use Topics  . Smoking status: Never Smoker   . Smokeless tobacco: None  . Alcohol Use: Yes     Comment: ocassional   OB History    No data available     Review of Systems  Constitutional: Positive for fever.  Gastrointestinal: Positive for nausea, vomiting, abdominal pain and constipation. Negative for blood in stool.  Musculoskeletal: Positive for back pain.  All other systems reviewed and are negative.   Allergies   Ibuprofen  Home Medications   Prior to Admission medications   Not on File   BP 128/82 mmHg  Pulse 113  Temp(Src) 98.4 F (36.9 C) (Oral)  Resp 16  SpO2 98%  LMP 05/04/2015 Physical Exam  Constitutional: She is oriented to person, place, and time. She appears well-developed and well-nourished. No distress.  HENT:  Head: Normocephalic and atraumatic.  Eyes: Conjunctivae and EOM are normal.  Neck: Neck supple. No tracheal deviation present.  Cardiovascular: Normal rate.   Pulmonary/Chest: Effort normal. No respiratory distress.  Abdominal: She exhibits distension. There is tenderness. There is guarding.  Mild distension. Mild LLQ tenderness. Voluntary guarding.  Musculoskeletal: Normal range of motion.  Neurological: She is alert and oriented to person, place, and time.  Skin: Skin is warm and dry.  Psychiatric: She has a normal mood and affect. Her behavior is normal.  Nursing note and vitals reviewed.   ED Course  Procedures   DIAGNOSTIC STUDIES: Oxygen Saturation is 98% on RA, normal by my interpretation.    COORDINATION OF CARE: 1:38 AM Will order IV fluids and pain medication. Discussed treatment plan with pt at bedside and pt agreed to plan.   Labs Review Labs Reviewed  COMPREHENSIVE METABOLIC PANEL - Abnormal; Notable for the following:    Potassium 3.3 (*)    Glucose, Bld 138 (*)    Calcium 8.7 (*)    Albumin 3.4 (*)    ALT 10 (*)    All other components within normal limits  CBC WITH DIFFERENTIAL/PLATELET - Abnormal;  Notable for the following:    WBC 29.3 (*)    Hemoglobin 11.4 (*)    HCT 34.2 (*)    Platelets 484 (*)    All other components within normal limits  URINALYSIS, ROUTINE W REFLEX MICROSCOPIC (NOT AT [facility]) - Abnormal; Notable for the following:    Color, Urine AMBER (*)    APPearance CLOUDY (*)    Hgb urine dipstick SMALL (*)    All other components within normal limits  URINE MICROSCOPIC-ADD ON - Abnormal; Notable for the following:     Squamous Epithelial / LPF 0-5 (*)    Bacteria, UA FEW (*)    All other components within normal limits  HCG, QUANTITATIVE, PREGNANCY    Imaging Review No results found.   Mr Pelvis W Wo Contrast  05/16/2015  CLINICAL DATA:  Evaluate previously noted liver lesions. Uterine fibroids. Evaluate for sarcoma. EXAM: MRI ABDOMEN AND PELVIS WITHOUT CONTRAST TECHNIQUE: Multiplanar multisequence MR imaging of the abdomen and pelvis was performed. No intravenous contrast was administered. COMPARISON:  05/14/2015 FINDINGS: MRI ABDOMEN FINDINGS Lower chest: Small pleural effusions are noted right greater than left. Hepatobiliary: There are multiple arterial phase enhancing liver lesions identified within both lobes. The largest is in the posterior right lobe of liver measuring 4.3 cm, image 18 of series 1901. This demonstrates avid arterial enhancement. Multiple arterial phase enhancing lesions are also noted within the left lobe. The largest measures 3.2 cm, image 31 of series 1901. Nonenhancing T2 hyperintense structure within the medial segment of left lobe of liver is favored to represent a simple cyst. This measures 1.9 cm, image 25 of series 3. The gallbladder appears normal. No biliary dilatation. Pancreas:  The pancreas appears normal. Spleen:  Within normal limits in size and appearance. Adrenals/Urinary Tract: No masses identified. No evidence of hydronephrosis. Stomach/Bowel: Visualized portions within the abdomen are unremarkable. Vascular/Lymphatic: No pathologically enlarged lymph nodes identified. No abdominal aortic aneurysm demonstrated. Other:  None. Musculoskeletal:  No suspicious bone lesions identified. MRI PELVIS FINDINGS Urinary Tract: The urinary bladder appears normal. Bowel: Wall thickening and inflammation involving the sigmoid colon is again noted, image 27 of series 13. Fluid collection within the pelvis ventral to the rectum measures 4.2 cm, image 32 of series 13. This is compared with  3.4 cm previously. Vascular/Lymphatic: The iliac vasculature is unremarkable. No adenopathy. Reproductive: Enlarged fibroid uterus is identified. This measures 12.1 x 8.1 x 17.3 cm. This has a volume of 847 cc. There are multiple fibroids in varying locations including subserosal, intramural and submucosal. There is fairly uniform enhancement of these fibroids following the IV administration of contrast. The largest fibroid is subserosal arising from the anterior lower uterine segment and measures 5.6 cm, image 14 of series 14. Arising from the left side of uterus is 86.1 cm fibroid, image 19 of series 11. This is also partially sub serosal. The endometrium is difficult to visualize due to distortion by fibroids. The cervix appears dilated and fluid-filled, image 17 of series 11. Cystic structure within the right adnexa measures 2.4 cm, image 14 of series 11. This is favored to represent an ovarian cyst. Several small follicles are noted within the left ovary, image 24 series 11. Other: Trace fluid identified within the pelvis. Musculoskeletal: Normal signal identified from within the bone marrow. IMPRESSION: 1. Multifocal arterial phase enhancing lesions are identified within both lobes of liver. In a patient that is at low risk for malignancy these are favored to represent either multiple [place] is versus  liver adenomas. Hypervascular liver metastases or primary liver malignancy is considered less favored. Differentiation of FNH from adenoma is recommended for lesions greater than 4 cm. In a patient that is at low risk for either primary liver malignancy or metastatic disease 3 to six-month followup imaging with MRI of the liver performed with Gretta Cool is advised. 2. Enlarged uterus containing multiple enhancing masses. Statistically these likely represent multiple benign uterine fibroids. 3. Imaging findings compatible with sigmoid colitis. 4. Persistent pelvic fluid collection, likely abscess secondary to colitis.  When compared with recent CT of the pelvis this fluid collection appears slightly increased in size in the interval. Electronically Signed   By: Kerby Moors M.D.   On: 05/16/2015 12:01   Mr Abdomen W Wo Contrast  05/16/2015  CLINICAL DATA:  Evaluate previously noted liver lesions. Uterine fibroids. Evaluate for sarcoma. EXAM: MRI ABDOMEN AND PELVIS WITHOUT CONTRAST TECHNIQUE: Multiplanar multisequence MR imaging of the abdomen and pelvis was performed. No intravenous contrast was administered. COMPARISON:  05/14/2015 FINDINGS: MRI ABDOMEN FINDINGS Lower chest: Small pleural effusions are noted right greater than left. Hepatobiliary: There are multiple arterial phase enhancing liver lesions identified within both lobes. The largest is in the posterior right lobe of liver measuring 4.3 cm, image 18 of series 1901. This demonstrates avid arterial enhancement. Multiple arterial phase enhancing lesions are also noted within the left lobe. The largest measures 3.2 cm, image 31 of series 1901. Nonenhancing T2 hyperintense structure within the medial segment of left lobe of liver is favored to represent a simple cyst. This measures 1.9 cm, image 25 of series 3. The gallbladder appears normal. No biliary dilatation. Pancreas:  The pancreas appears normal. Spleen:  Within normal limits in size and appearance. Adrenals/Urinary Tract: No masses identified. No evidence of hydronephrosis. Stomach/Bowel: Visualized portions within the abdomen are unremarkable. Vascular/Lymphatic: No pathologically enlarged lymph nodes identified. No abdominal aortic aneurysm demonstrated. Other:  None. Musculoskeletal:  No suspicious bone lesions identified. MRI PELVIS FINDINGS Urinary Tract: The urinary bladder appears normal. Bowel: Wall thickening and inflammation involving the sigmoid colon is again noted, image 27 of series 13. Fluid collection within the pelvis ventral to the rectum measures 4.2 cm, image 32 of series 13. This is  compared with 3.4 cm previously. Vascular/Lymphatic: The iliac vasculature is unremarkable. No adenopathy. Reproductive: Enlarged fibroid uterus is identified. This measures 12.1 x 8.1 x 17.3 cm. This has a volume of 847 cc. There are multiple fibroids in varying locations including subserosal, intramural and submucosal. There is fairly uniform enhancement of these fibroids following the IV administration of contrast. The largest fibroid is subserosal arising from the anterior lower uterine segment and measures 5.6 cm, image 14 of series 14. Arising from the left side of uterus is 86.1 cm fibroid, image 19 of series 11. This is also partially sub serosal. The endometrium is difficult to visualize due to distortion by fibroids. The cervix appears dilated and fluid-filled, image 17 of series 11. Cystic structure within the right adnexa measures 2.4 cm, image 14 of series 11. This is favored to represent an ovarian cyst. Several small follicles are noted within the left ovary, image 24 series 11. Other: Trace fluid identified within the pelvis. Musculoskeletal: Normal signal identified from within the bone marrow. IMPRESSION: 1. Multifocal arterial phase enhancing lesions are identified within both lobes of liver. In a patient that is at low risk for malignancy these are favored to represent either multiple [place] is versus liver adenomas. Hypervascular liver metastases or primary  liver malignancy is considered less favored. Differentiation of FNH from adenoma is recommended for lesions greater than 4 cm. In a patient that is at low risk for either primary liver malignancy or metastatic disease 3 to six-month followup imaging with MRI of the liver performed with Gretta Cool is advised. 2. Enlarged uterus containing multiple enhancing masses. Statistically these likely represent multiple benign uterine fibroids. 3. Imaging findings compatible with sigmoid colitis. 4. Persistent pelvic fluid collection, likely abscess secondary  to colitis. When compared with recent CT of the pelvis this fluid collection appears slightly increased in size in the interval. Electronically Signed   By: Kerby Moors M.D.   On: 05/16/2015 12:01   Ct Abdomen Pelvis W Contrast  05/14/2015  CLINICAL DATA:  45 year old female with abdominal pain standard in the left lower quadrant. Abdominal cramping and leukocytosis. EXAM: CT ABDOMEN AND PELVIS WITH CONTRAST TECHNIQUE: Multidetector CT imaging of the abdomen and pelvis was performed using the standard protocol following bolus administration of intravenous contrast. CONTRAST:  71mL OMNIPAQUE IOHEXOL 300 MG/ML SOLN, 193mL ISOVUE-300 IOPAMIDOL (ISOVUE-300) INJECTION 61% COMPARISON:  None FINDINGS: The visualized lung bases are clear. No intra-abdominal free air. Trace free fluid within the pelvis. There multiple enhancing liver lesions. The largest lesion is located in the right lobe of the liver and measures approximately 5.0 x 4.0 cm and appears to have a central scar. Differentials include hemangioma, adenoma, focal nodular hyperplasia. Hepatocellular carcinoma or metastatic disease are not excluded. MRI without and with contrast is recommended for further characterization. There is a 2.3 x 1.4 cm hypodense lesion in the left lobe of the liver (segment IV B), possibly a cyst. Stop the gallbladder, pancreas, spleen, adrenal glands all, kidneys, visualized ureters, and urinary bladder appear unremarkable. The uterus is enlarged and myomatous. There is a 2.5 x 1.5 cm cystic structure in the region of the right ovary, likely a dominant follicle/cyst. There is a 2.5 x 4.6 cm loculated fluid collection in the cul-de-sac (pouch of [place]) most compatible with an abscess. There is apparent segmental thickening of the rectosigmoid wall with haziness and enhancement of the mucosa concerning for colitis. Loose stool noted within the colon compatible with diarrheal state. There is no evidence of bowel obstruction. Normal  appendix. The abdominal aorta and IVC appear unremarkable. No portal venous gas identified. There is no adenopathy. The abdominal wall soft tissues appear unremarkable. The osseous structures are intact. IMPRESSION: Diarrheal state with findings most compatible with colitis of the rectosigmoid. A small loculated fluid collection/abscess noted in the retrouterine space. Multiple enhancing hepatic lesions as described. Nonemergent MRI without and with contrast is recommended for further characterization. Enlarged and myomatous uterus. Right ovarian probable dominant follicle/cyst. Electronically Signed   By: Anner Crete M.D.   On: 05/14/2015 03:29   Ct Image Guided Drainage By Percutaneous Catheter  05/17/2015  INDICATION: Pelvic abscess EXAM: CT IMAGE GUIDED DRAINAGE BY PERCUTANEOUS CATHETER MEDICATIONS: The patient is currently admitted to the hospital and receiving intravenous antibiotics. The antibiotics were administered within an appropriate time frame prior to the initiation of the procedure. ANESTHESIA/SEDATION: Fentanyl 5 mcg IV; Versed 100 mg IV Moderate Sedation Time:  21 The patient was continuously monitored during the procedure by the interventional radiology nurse under my direct supervision. COMPLICATIONS: None immediate. PROCEDURE: Informed written consent was obtained from the patient after a thorough discussion of the procedural risks, benefits and alternatives. All questions were addressed. Maximal Sterile Barrier Technique was utilized including caps, mask, sterile gowns, sterile gloves, sterile  drape, hand hygiene and skin antiseptic. A timeout was performed prior to the initiation of the procedure. The left gluteal region was prepped and draped in a sterile fashion. Under CT guidance, an 18 gauge needle was inserted into the pelvic abscess via left trans gluteal approach. Ten [place] dilator followed by a 10 [place] drain were inserted. It was looped and string fixed in the fluid  collection then sewn to the skin. Frank pus was aspirated FINDINGS: Imaging confirms placement of a left 10 [place] trans gluteal pelvic abscess. IMPRESSION: Successful trans gluteal 10 French left pelvic abscess drain Electronically Signed   By: Marybelle Killings M.D.   On: 05/17/2015 11:11   I have personally reviewed and evaluated these images and lab results as part of my medical decision-making.   EKG Interpretation None      MDM   Final diagnoses:  Colitis  Pelvic fluid collection    45 year old female with lower abdominal pain. Imaging as above. Discussed with surgery. Requesting medicine admission. Antibiotics ordered. Discuss findings with pt/husband. Admit.   I personally preformed the services scribed in my presence. The recorded information has been reviewed is accurate. Virgel Manifold, MD.    Virgel Manifold, MD 05/17/15 1213

## 2015-05-14 NOTE — H&P (Signed)
[facility] History and Physical  Cheryl Grant [id number] DOB: 08-24-1970 DOA: 05/13/2015  Referring physician: Virgel Manifold, M.D. PCP: No primary care provider on file.   Chief Complaint: Abdominal pain.  HPI: Cheryl Grant is a 45 y.o. female with no past medical history who comes to emergency department due to abdominal pain since early morning yesterday morning associated with constipation.  Per patient, she woke up yesterday around 6 in the morning with intense left lower quadrant pain. She states that she felt like she had an obstruction, because she was unable to have a bowel movement. She said that she took 3 different types of medications for constipation and had a bowel movement around 3 PM showing very hard stools. The patient stated that she felt like she was passing a rock. Associated symptoms have been abdominal distention, 3 episodes of nausea with emesis and fever.  When seen in the emergency department, the patient stated that she felt a lot better after she was given analgesics. Workup shows leukocytosis of 20 9.3K and a CT scan of the abdomen showing findings consistent with colitis and the retro-uterine abscess or fluid collection.   Review of Systems:  Constitutional: Positive Fevers, chills, fatigue.  No weight loss, night sweats HEENT:  No headaches, Difficulty swallowing,Tooth/dental problems,Sore throat,  No sneezing, itching, ear ache, nasal congestion, post nasal drip,  Cardio-vascular:  No chest pain, Orthopnea, PND, swelling in lower extremities, anasarca, dizziness, palpitations  GI:  Positive abdominal pain, nausea, vomiting, constipation, loss of appetite  No heartburn, indigestion, diarrhea  Resp:  No shortness of breath with exertion or at rest. No excess mucus, no productive cough, No non-productive cough, No coughing up of blood.No change in color of mucus.No wheezing.No chest wall deformity  Skin:  no rash or lesions.  GU:  no  dysuria, change in color of urine, no urgency or frequency. No flank pain.  Musculoskeletal:  Positive back pain.  No joint pain or swelling. No decreased range of motion. Psych:  No change in mood or affect. No depression or anxiety. No memory loss.   Past Medical History  Diagnosis Date  . Uterine fibroid    Past Surgical History  Procedure Laterality Date  . Cesarean section  2003    Primary cesarean section, Kroneg incision  . Robot assisted myomectomy  2007    Dr Dellis Filbert   Social History:  reports that she has never smoked. She does not have any smokeless tobacco history on file. She reports that she drinks alcohol. She reports that she does not use illicit drugs.  Allergies  Allergen Reactions  . Ibuprofen Hives    History reviewed. No pertinent family history.   Prior to Admission medications   Not on File   Physical Exam: Filed Vitals:   05/13/15 2335 05/14/15 0225 05/14/15 0548  BP: 128/82 110/70 110/71  Pulse: 113 98 89  Temp: 98.4 F (36.9 C)    TempSrc: Oral    Resp: 16 20 19   SpO2: 98% 96% 97%    Wt Readings from Last 3 Encounters:  No data found for Wt    General:  Appears calm and comfortable Eyes: PERRL, normal lids, irises & conjunctiva ENT: grossly normal hearing, lips & tongue Neck: no LAD, masses or thyromegaly Cardiovascular: RRR, no m/r/g. No LE edema. Telemetry: SR, no arrhythmias  Respiratory: CTA bilaterally, no w/r/r. Normal respiratory effort. Abdomen: Distended, bowel sounds are hyperactive, soft, positive LLQ tenderness and guarding, no rebound. Skin: no rash  or induration seen on limited exam Musculoskeletal: grossly normal tone BUE/BLE Psychiatric: grossly normal mood and affect, speech fluent and appropriate Neurologic: Awake, alert, oriented 4, grossly non-focal.          Labs on Admission:  Basic Metabolic Panel:  Recent Labs Lab 05/14/15 0035  NA 137  K 3.3*  CL 102  CO2 26  GLUCOSE 138*  BUN 7  CREATININE  0.69  CALCIUM 8.7*   Liver Function Tests:  Recent Labs Lab 05/14/15 0035  AST 16  ALT 10*  ALKPHOS 69  BILITOT 1.1  PROT 7.7  ALBUMIN 3.4*   CBC:  Recent Labs Lab 05/14/15 0035  WBC 29.3*  NEUTROABS 27.2*  HGB 11.4*  HCT 34.2*  MCV 86.1  PLT 484*    Radiological Exams on Admission: Ct Abdomen Pelvis W Contrast  05/14/2015  CLINICAL DATA:  45 year old female with abdominal pain standard in the left lower quadrant. Abdominal cramping and leukocytosis. EXAM: CT ABDOMEN AND PELVIS WITH CONTRAST TECHNIQUE: Multidetector CT imaging of the abdomen and pelvis was performed using the standard protocol following bolus administration of intravenous contrast. CONTRAST:  68mL OMNIPAQUE IOHEXOL 300 MG/ML SOLN, 139mL ISOVUE-300 IOPAMIDOL (ISOVUE-300) INJECTION 61% COMPARISON:  None FINDINGS: The visualized lung bases are clear. No intra-abdominal free air. Trace free fluid within the pelvis. There multiple enhancing liver lesions. The largest lesion is located in the right lobe of the liver and measures approximately 5.0 x 4.0 cm and appears to have a central scar. Differentials include hemangioma, adenoma, focal nodular hyperplasia. Hepatocellular carcinoma or metastatic disease are not excluded. MRI without and with contrast is recommended for further characterization. There is a 2.3 x 1.4 cm hypodense lesion in the left lobe of the liver (segment IV B), possibly a cyst. Stop the gallbladder, pancreas, spleen, adrenal glands all, kidneys, visualized ureters, and urinary bladder appear unremarkable. The uterus is enlarged and myomatous. There is a 2.5 x 1.5 cm cystic structure in the region of the right ovary, likely a dominant follicle/cyst. There is a 2.5 x 4.6 cm loculated fluid collection in the cul-de-sac (pouch of [place]) most compatible with an abscess. There is apparent segmental thickening of the rectosigmoid wall with haziness and enhancement of the mucosa concerning for colitis. Loose  stool noted within the colon compatible with diarrheal state. There is no evidence of bowel obstruction. Normal appendix. The abdominal aorta and IVC appear unremarkable. No portal venous gas identified. There is no adenopathy. The abdominal wall soft tissues appear unremarkable. The osseous structures are intact. IMPRESSION: Diarrheal state with findings most compatible with colitis of the rectosigmoid. A small loculated fluid collection/abscess noted in the retrouterine space. Multiple enhancing hepatic lesions as described. Nonemergent MRI without and with contrast is recommended for further characterization. Enlarged and myomatous uterus. Right ovarian probable dominant follicle/cyst. Electronically Signed   By: Anner Crete M.D.   On: 05/14/2015 03:29       Assessment/Plan Principal Problem:   Colitis   Pelvic fluid collection/abscess in retrouterine space Admit to MedSurg/inpatient Keep nothing by mouth. Continue IV fluids. Continue analgesics and antiemetics. Continue IV antibiotics.  Active Problems:   Liver masses Check MRI of the abdomen as suggested by radiology.      Fibroid uterus Patient to follow up with GYN    Hypokalemia Continue potassium replacement. Monitor potassium level   General surgery who is consulting.   Code Status: Full code. DVT Prophylaxis: SCDs. Family Communication:  Disposition Plan: Admit for IV antibiotic therapy and general  surgery consultation.  Time spent: Over 70 minutes were spent in the procedures admission.  Reubin Milan, M.D. [facility] Pager [phone].

## 2015-05-14 NOTE — Progress Notes (Signed)
   [facility]                                                                              Patient Demographics  Cheryl Grant, is a 45 y.o. female, DOB - 12-Jul-1970, [id number]  Admit date - 05/13/2015   Admitting Physician Reubin Milan, MD  Outpatient Primary MD for the patient is No primary care provider on file.  LOS - 0   No chief complaint on file.     HPI on 05/14/2015 by Dr. Gerri Lins Cheryl Grant is a 45 y.o. female with no past medical history who comes to emergency department due to abdominal pain since early morning yesterday morning associated with constipation.  Per patient, she woke up yesterday around 6 in the morning with intense left lower quadrant pain. She states that she felt like she had an obstruction, because she was unable to have a bowel movement. She said that she took 3 different types of medications for constipation and had a bowel movement around 3 PM showing very hard stools. The patient stated that she felt like she was passing a rock. Associated symptoms have been abdominal distention, 3 episodes of nausea with emesis and fever.  When seen in the emergency department, the patient stated that she felt a lot better after she was given analgesics. Workup shows leukocytosis of 20 9.3K and a CT scan of the abdomen showing findings consistent with colitis and the retro-uterine abscess or fluid collection.  Assessment & Plan   Patient admitted earlier today by Dr. Tennis Must.  See H&P for details.  Agree with assessment and plan.  Sepsis secondary to colitis/ Abdominal pain secondary to colitis/obstipation Upon admission, patient was tachycardic with leukocytosis and elevated lactic acid -CT of the abdomen pelvis: Diarrheal state findings most compatible with colitis of the rectosigmoid. A small loculated fluid collection/abscess noted in the retrouterine space -Continue ceftriaxone and Flagyl -Gen. surgery consulted recommended IR for  possible drain placement. -Spoke with IR today, felt that abscess/fluid collection was small for drain placement. Perhaps will resolve within the next couple of days. Repeat CT scan 3 days.  Liver mass -Noted on CT scan. Multiple enhancing hepatic lesions. Nonemergent MRI recommended.  Uterine fibroid -Patient will need gynecologic follow-up  Hypokalemia -Replacing continue to monitor BMP  Code Status: Full  Family Communication: None at bedside  Disposition Plan: Admitted.   Time Spent in minutes   30 minutes  Procedures  None  Consults   General surgery Interventional radiology   DVT Prophylaxis  SCDs  Cheryl Grant D.O. on 05/14/2015 at 2:20 PM  Between 7am to 7pm - Pager - [phone]  After 7pm go to [email or web address] - password [email or web address]  And look for the night coverage person covering for me after hours  [facility] Office  [phone]

## 2015-05-15 DIAGNOSIS — R188 Other ascites: Secondary | ICD-10-CM

## 2015-05-15 DIAGNOSIS — E876 Hypokalemia: Secondary | ICD-10-CM

## 2015-05-15 DIAGNOSIS — K529 Noninfective gastroenteritis and colitis, unspecified: Secondary | ICD-10-CM

## 2015-05-15 DIAGNOSIS — E46 Unspecified protein-calorie malnutrition: Secondary | ICD-10-CM

## 2015-05-15 DIAGNOSIS — R160 Hepatomegaly, not elsewhere classified: Secondary | ICD-10-CM

## 2015-05-15 DIAGNOSIS — K5900 Constipation, unspecified: Secondary | ICD-10-CM

## 2015-05-15 LAB — COMPREHENSIVE METABOLIC PANEL
ALT: 8 U/L — ABNORMAL LOW (ref 14–54)
AST: 12 U/L — ABNORMAL LOW (ref 15–41)
Albumin: 2.6 g/dL — ABNORMAL LOW (ref 3.5–5.0)
Alkaline Phosphatase: 54 U/L (ref 38–126)
Anion gap: 7 (ref 5–15)
BUN: 6 mg/dL (ref 6–20)
CHLORIDE: 109 mmol/L (ref 101–111)
CO2: 24 mmol/L (ref 22–32)
Calcium: 7.7 mg/dL — ABNORMAL LOW (ref 8.9–10.3)
Creatinine, Ser: 0.53 mg/dL (ref 0.44–1.00)
Glucose, Bld: 100 mg/dL — ABNORMAL HIGH (ref 65–99)
POTASSIUM: 3.8 mmol/L (ref 3.5–5.1)
Sodium: 140 mmol/L (ref 135–145)
Total Bilirubin: 0.3 mg/dL (ref 0.3–1.2)
Total Protein: 6.2 g/dL — ABNORMAL LOW (ref 6.5–8.1)

## 2015-05-15 LAB — CBC WITH DIFFERENTIAL/PLATELET
BASOS ABS: 0 10*3/uL (ref 0.0–0.1)
Basophils Relative: 0 %
EOS PCT: 0 %
Eosinophils Absolute: 0.1 10*3/uL (ref 0.0–0.7)
HCT: 29.2 % — ABNORMAL LOW (ref 36.0–46.0)
Hemoglobin: 9.6 g/dL — ABNORMAL LOW (ref 12.0–15.0)
LYMPHS PCT: 8 %
Lymphs Abs: 1.7 10*3/uL (ref 0.7–4.0)
MCH: 28.7 pg (ref 26.0–34.0)
MCHC: 32.9 g/dL (ref 30.0–36.0)
MCV: 87.2 fL (ref 78.0–100.0)
MONO ABS: 1.2 10*3/uL — AB (ref 0.1–1.0)
Monocytes Relative: 6 %
Neutro Abs: 17.5 10*3/uL — ABNORMAL HIGH (ref 1.7–7.7)
Neutrophils Relative %: 86 %
PLATELETS: 397 10*3/uL (ref 150–400)
RBC: 3.35 MIL/uL — ABNORMAL LOW (ref 3.87–5.11)
RDW: 13.9 % (ref 11.5–15.5)
WBC: 20.6 10*3/uL — ABNORMAL HIGH (ref 4.0–10.5)

## 2015-05-15 LAB — PHOSPHORUS: Phosphorus: 1.9 mg/dL — ABNORMAL LOW (ref 2.5–4.6)

## 2015-05-15 LAB — PREALBUMIN: Prealbumin: 6.5 mg/dL — ABNORMAL LOW (ref 18–38)

## 2015-05-15 LAB — MAGNESIUM: MAGNESIUM: 2 mg/dL (ref 1.7–2.4)

## 2015-05-15 MED ORDER — LACTATED RINGERS IV BOLUS (SEPSIS): 1000.0000 mL | Freq: Three times a day (TID) | INTRAVENOUS | Status: AC | PRN

## 2015-05-15 MED ORDER — FLUCONAZOLE 200 MG PO TABS
200.0000 mg | ORAL_TABLET | Freq: Every day | ORAL | Status: DC
Start: 2015-05-15 — End: 2015-05-18
  Administered 2015-05-15 – 2015-05-18 (×4): 200 mg via ORAL
  Filled 2015-05-15 (×5): qty 1

## 2015-05-15 NOTE — Progress Notes (Signed)
[facility]  [address] [place], [place] [address] Phone: [phone] FAX: [place] [phone] [birth year]   Assessment  Problem List:   Principal Problem:   Colitis Active Problems:   Liver masses   Pelvic fluid collection   Fibroid uterus   Hypokalemia   Constipation, chronic   Protein-calorie malnutrition (HCC)      * No surgery found *      Improving  Plan:  -IV ABx -fluconazole x 1 to minimize risk of yeast infection (issue in past)  -pelvic collection not amenable to perc drainage per Int Rad given pelvic location - follow up CT scan ~3/30 to see if resolving.  White count better.  Pain less.  Ileus less.  Hopefully will improve without a box alone.  Consider gastroenterology evaluation of colon once abscess collection improved to diagnose colitis  -better evaluate liver & pelvic masses.  ?fibroid uterus ?[facility]  ?Met uterine sarcoma?  D/w Dr Lahoma Crocker, Gyn who feels MRI would be helpful.  Try tomorrow since they usually do not do these things on the weekend.  Do not think it is a good idea to delay workup as an outpatient.  GynOnc consult if MRI concerning  -Okay to try and advance diet since tolerated some falls.  Low threshold to back off to clears only if he feels worse.  Do not want to force by mouth.  -Suspect malnutrition.  Check nutrition numbers and reevaluate.  May benefit from supplemental shakes if oral intake fair.  -VTE prophylaxis- SCDs, etc  -mobilize as tolerated to help recovery  The patient is stable.  There is no evidence of peritonitis, acute abdomen, nor shock.  There is no strong evidence of failure of improvement nor decline with current non-operative management.  There is no need for surgery at the present moment.  We will continue to follow.   Adin Hector, M.D., F.A.C.S. Gastrointestinal and Minimally Invasive Surgery [facility] [address] [place], [place] [address] [phone] Main / Paging   05/15/2015  Subjective:  Less sore/bloated Tol liquids a little but appetite still down Having some loose BMs  Objective:  Vital signs:  Filed Vitals:   05/14/15 0700 05/14/15 1349 05/14/15 2134 05/15/15 0443  BP: 105/62 111/66 120/63 101/63  Pulse: 91 79 80 83  Temp: 97.8 F (36.6 C) 98.3 F (36.8 C) 98 F (36.7 C) 97.7 F (36.5 C)  TempSrc: Oral Oral Oral Oral  Resp: '20 20 18 16  ' SpO2: 99% 100% 100% 99%    Last BM Date: 05/14/15  Intake/Output   Yesterday:  03/25 0701 - 03/26 0700 In: 990 [I.V.:990] Out: 800 [Urine:800] This shift:     Bowel function:  Flatus: y  BM: y  Drain: n/a  Physical Exam:  General: Pt awake/alert/oriented x4 in no acute distress.  Still a little cachectic Eyes: PERRL, normal EOM.  Sclera clear.  No icterus Neuro: CN II-XII intact w/o focal sensory/motor deficits. Lymph: No head/neck/groin lymphadenopathy Psych:  No delerium/psychosis/paranoia HENT: Normocephalic, Mucus membranes moist.  No thrush Neck: Supple, No tracheal deviation Chest: No chest wall pain w good excursion CV:  Pulses intact.  Regular rhythm MS: Normal AROM mjr joints.  No obvious deformity Abdomen: Soft.  Nondistended.  Abd mass c/w giant uterus unchanged.  Mildly tender at incisions only.  No evidence of peritonitis.  No incarcerated hernias. Ext:  SCDs BLE.  No mjr edema.  No cyanosis Skin: No petechiae / purpura  Results:   Labs: Results for orders placed or performed during the hospital encounter of 05/13/15 (from the past 48 hour(s))  Urinalysis, Routine w reflex microscopic (not at [facility])     Status: Abnormal   Collection Time: 05/14/15 12:12 AM  Result Value Ref Range   Color, Urine AMBER (A) YELLOW    Comment: BIOCHEMICALS MAY BE AFFECTED BY COLOR   APPearance CLOUDY (A) CLEAR   Specific Gravity, Urine 1.021 1.005 - 1.030   pH 5.5 5.0 - 8.0    Glucose, UA NEGATIVE NEGATIVE mg/dL   Hgb urine dipstick SMALL (A) NEGATIVE   Bilirubin Urine NEGATIVE NEGATIVE   Ketones, ur NEGATIVE NEGATIVE mg/dL   Protein, ur NEGATIVE NEGATIVE mg/dL   Nitrite NEGATIVE NEGATIVE   Leukocytes, UA NEGATIVE NEGATIVE  Urine microscopic-add on     Status: Abnormal   Collection Time: 05/14/15 12:12 AM  Result Value Ref Range   Squamous Epithelial / LPF 0-5 (A) NONE SEEN   WBC, UA 0-5 0 - 5 WBC/hpf   RBC / HPF 6-30 0 - 5 RBC/hpf   Bacteria, UA FEW (A) NONE SEEN  Comprehensive metabolic panel     Status: Abnormal   Collection Time: 05/14/15 12:35 AM  Result Value Ref Range   Sodium 137 135 - 145 mmol/L   Potassium 3.3 (L) 3.5 - 5.1 mmol/L   Chloride 102 101 - 111 mmol/L   CO2 26 22 - 32 mmol/L   Glucose, Bld 138 (H) 65 - 99 mg/dL   BUN 7 6 - 20 mg/dL   Creatinine, Ser 0.69 0.44 - 1.00 mg/dL   Calcium 8.7 (L) 8.9 - 10.3 mg/dL   Total Protein 7.7 6.5 - 8.1 g/dL   Albumin 3.4 (L) 3.5 - 5.0 g/dL   AST 16 15 - 41 U/L   ALT 10 (L) 14 - 54 U/L   Alkaline Phosphatase 69 38 - 126 U/L   Total Bilirubin 1.1 0.3 - 1.2 mg/dL   GFR calc non Af Amer >60 >60 mL/min   GFR calc Af Amer >60 >60 mL/min    Comment: (NOTE) The eGFR has been calculated using the CKD EPI equation. This calculation has not been validated in all clinical situations. eGFR's persistently <60 mL/min signify possible Chronic Kidney Disease.    Anion gap 9 5 - 15  CBC WITH DIFFERENTIAL     Status: Abnormal   Collection Time: 05/14/15 12:35 AM  Result Value Ref Range   WBC 29.3 (H) 4.0 - 10.5 K/uL   RBC 3.97 3.87 - 5.11 MIL/uL   Hemoglobin 11.4 (L) 12.0 - 15.0 g/dL   HCT 34.2 (L) 36.0 - 46.0 %   MCV 86.1 78.0 - 100.0 fL   MCH 28.7 26.0 - 34.0 pg   MCHC 33.3 30.0 - 36.0 g/dL   RDW 13.5 11.5 - 15.5 %   Platelets 484 (H) 150 - 400 K/uL   Neutrophils Relative % 93 %   Lymphocytes Relative 4 %   Monocytes Relative 3 %   Eosinophils Relative 0 %   Basophils Relative 0 %   Neutro Abs  27.2 (H) 1.7 - 7.7 K/uL   Lymphs Abs 1.2 0.7 - 4.0 K/uL   Monocytes Absolute 0.9 0.1 - 1.0 K/uL   Eosinophils Absolute 0.0 0.0 - 0.7 K/uL   Basophils Absolute 0.0 0.0 - 0.1 K/uL   RBC Morphology POLYCHROMASIA PRESENT    WBC Morphology INCREASED BANDS (>20% BANDS)  hCG, quantitative, pregnancy     Status: None   Collection Time: 05/14/15 12:35 AM  Result Value Ref Range   hCG, Beta Chain, Quant, S <1 <5 mIU/mL    Comment:          GEST. AGE      CONC.  (mIU/mL)   <=1 WEEK        5 - 50     2 WEEKS       50 - 500     3 WEEKS       100 - 10,000     4 WEEKS     1,000 - 30,000     5 WEEKS     3,500 - 115,000   6-8 WEEKS     12,000 - 270,000    12 WEEKS     15,000 - 220,000        FEMALE AND NON-PREGNANT FEMALE:     LESS THAN 5 mIU/mL   Lactic acid, plasma     Status: Abnormal   Collection Time: 05/14/15  4:54 AM  Result Value Ref Range   Lactic Acid, Venous 2.2 (HH) 0.5 - 2.0 mmol/L    Comment: CRITICAL RESULT CALLED TO, READ BACK BY AND VERIFIED WITH: C.WARD,RN AT 4580 ON 05/14/15 BY W.SHEA   Lactic acid, plasma     Status: None   Collection Time: 05/14/15  6:50 AM  Result Value Ref Range   Lactic Acid, Venous 1.4 0.5 - 2.0 mmol/L  CBC WITH DIFFERENTIAL     Status: Abnormal   Collection Time: 05/15/15  4:06 AM  Result Value Ref Range   WBC 20.6 (H) 4.0 - 10.5 K/uL   RBC 3.35 (L) 3.87 - 5.11 MIL/uL   Hemoglobin 9.6 (L) 12.0 - 15.0 g/dL   HCT 29.2 (L) 36.0 - 46.0 %   MCV 87.2 78.0 - 100.0 fL   MCH 28.7 26.0 - 34.0 pg   MCHC 32.9 30.0 - 36.0 g/dL   RDW 13.9 11.5 - 15.5 %   Platelets 397 150 - 400 K/uL   Neutrophils Relative % 86 %   Neutro Abs 17.5 (H) 1.7 - 7.7 K/uL   Lymphocytes Relative 8 %   Lymphs Abs 1.7 0.7 - 4.0 K/uL   Monocytes Relative 6 %   Monocytes Absolute 1.2 (H) 0.1 - 1.0 K/uL   Eosinophils Relative 0 %   Eosinophils Absolute 0.1 0.0 - 0.7 K/uL   Basophils Relative 0 %   Basophils Absolute 0.0 0.0 - 0.1 K/uL  Comprehensive metabolic panel     Status:  Abnormal   Collection Time: 05/15/15  4:06 AM  Result Value Ref Range   Sodium 140 135 - 145 mmol/L   Potassium 3.8 3.5 - 5.1 mmol/L   Chloride 109 101 - 111 mmol/L   CO2 24 22 - 32 mmol/L   Glucose, Bld 100 (H) 65 - 99 mg/dL   BUN 6 6 - 20 mg/dL   Creatinine, Ser 0.53 0.44 - 1.00 mg/dL   Calcium 7.7 (L) 8.9 - 10.3 mg/dL   Total Protein 6.2 (L) 6.5 - 8.1 g/dL   Albumin 2.6 (L) 3.5 - 5.0 g/dL   AST 12 (L) 15 - 41 U/L   ALT 8 (L) 14 - 54 U/L   Alkaline Phosphatase 54 38 - 126 U/L   Total Bilirubin 0.3 0.3 - 1.2 mg/dL   GFR calc non Af Amer >60 >60 mL/min   GFR calc Af Amer >60 >60 mL/min  Comment: (NOTE) The eGFR has been calculated using the CKD EPI equation. This calculation has not been validated in all clinical situations. eGFR's persistently <60 mL/min signify possible Chronic Kidney Disease.    Anion gap 7 5 - 15    Imaging / Studies: Ct Abdomen Pelvis W Contrast  05/14/2015  CLINICAL DATA:  45 year old female with abdominal pain standard in the left lower quadrant. Abdominal cramping and leukocytosis. EXAM: CT ABDOMEN AND PELVIS WITH CONTRAST TECHNIQUE: Multidetector CT imaging of the abdomen and pelvis was performed using the standard protocol following bolus administration of intravenous contrast. CONTRAST:  37m OMNIPAQUE IOHEXOL 300 MG/ML SOLN, 1092mISOVUE-300 IOPAMIDOL (ISOVUE-300) INJECTION 61% COMPARISON:  None FINDINGS: The visualized lung bases are clear. No intra-abdominal free air. Trace free fluid within the pelvis. There multiple enhancing liver lesions. The largest lesion is located in the right lobe of the liver and measures approximately 5.0 x 4.0 cm and appears to have a central scar. Differentials include hemangioma, adenoma, focal nodular hyperplasia. Hepatocellular carcinoma or metastatic disease are not excluded. MRI without and with contrast is recommended for further characterization. There is a 2.3 x 1.4 cm hypodense lesion in the left lobe of the liver  (segment IV B), possibly a cyst. Stop the gallbladder, pancreas, spleen, adrenal glands all, kidneys, visualized ureters, and urinary bladder appear unremarkable. The uterus is enlarged and myomatous. There is a 2.5 x 1.5 cm cystic structure in the region of the right ovary, likely a dominant follicle/cyst. There is a 2.5 x 4.6 cm loculated fluid collection in the cul-de-sac (pouch of [place]) most compatible with an abscess. There is apparent segmental thickening of the rectosigmoid wall with haziness and enhancement of the mucosa concerning for colitis. Loose stool noted within the colon compatible with diarrheal state. There is no evidence of bowel obstruction. Normal appendix. The abdominal aorta and IVC appear unremarkable. No portal venous gas identified. There is no adenopathy. The abdominal wall soft tissues appear unremarkable. The osseous structures are intact. IMPRESSION: Diarrheal state with findings most compatible with colitis of the rectosigmoid. A small loculated fluid collection/abscess noted in the retrouterine space. Multiple enhancing hepatic lesions as described. Nonemergent MRI without and with contrast is recommended for further characterization. Enlarged and myomatous uterus. Right ovarian probable dominant follicle/cyst. Electronically Signed   By: ArAnner Crete.D.   On: 05/14/2015 03:29    Medications / Allergies: per chart  Antibiotics: Anti-infectives    Start     Dose/Rate Route Frequency Ordered Stop   05/14/15 1200  piperacillin-tazobactam (ZOSYN) IVPB 3.375 g     3.375 g 12.5 mL/hr over 240 Minutes Intravenous Every 8 hours 05/14/15 0748     05/14/15 0745  piperacillin-tazobactam (ZOSYN) IVPB 3.375 g  Status:  Discontinued     3.375 g 100 mL/hr over 30 Minutes Intravenous 3 times per day 05/14/15 0744 05/14/15 0747   05/14/15 0715  cefTRIAXone (ROCEPHIN) 2 g in dextrose 5 % 50 mL IVPB    Comments:  Pharmacy may adjust dosing strength / duration / interval for  maximal efficacy   2 g 100 mL/hr over 30 Minutes Intravenous Every 24 hours 05/14/15 0702     05/14/15 0715  metroNIDAZOLE (FLAGYL) IVPB 500 mg     500 mg 100 mL/hr over 60 Minutes Intravenous Every 6 hours 05/14/15 0702     05/14/15 0345  piperacillin-tazobactam (ZOSYN) IVPB 3.375 g     3.375 g 100 mL/hr over 30 Minutes Intravenous  Once 05/14/15 0337 05/14/15 0546  Note: Portions of this report may have been transcribed using voice recognition software. Every effort was made to ensure accuracy; however, inadvertent computerized transcription errors may be present.   Any transcriptional errors that result from this process are unintentional.     Adin Hector, M.D., F.A.C.S. Gastrointestinal and Minimally Invasive Surgery [facility] [address], [place], [place] [address] [phone] Main / Paging   05/15/2015  CARE TEAM:  PCP: No primary care provider on file.  Outpatient Care Team: Patient Care Team: Princess Bruins, MD as Consulting Physician (Obstetrics and Gynecology)  Inpatient Treatment Team: Treatment Team: Attending Provider: Cristal Ford, DO; Consulting Physician: Nolon Nations, MD; Rounding Team: Redmond Baseman, MD; Registered Nurse: Celedonio Savage, RN; Registered Nurse: Cheryle Horsfall, RN

## 2015-05-15 NOTE — Progress Notes (Signed)
[facility]                                                                              Patient Demographics  Cheryl Grant, is a 45 y.o. female, DOB - 1970-03-25, [id number]  Admit date - 05/13/2015   Admitting Physician Reubin Milan, MD  Outpatient Primary MD for the patient is No primary care provider on file.  LOS - 1   No chief complaint on file.     HPI on 05/14/2015 by Dr. Gerri Lins Cheryl Grant is a 45 y.o. female with no past medical history who comes to emergency department due to abdominal pain since early morning yesterday morning associated with constipation.  Per patient, she woke up yesterday around 6 in the morning with intense left lower quadrant pain. She states that she felt like she had an obstruction, because she was unable to have a bowel movement. She said that she took 3 different types of medications for constipation and had a bowel movement around 3 PM showing very hard stools. The patient stated that she felt like she was passing a rock. Associated symptoms have been abdominal distention, 3 episodes of nausea with emesis and fever.  When seen in the emergency department, the patient stated that she felt a lot better after she was given analgesics. Workup shows leukocytosis of 20 9.3K and a CT scan of the abdomen showing findings consistent with colitis and the retro-uterine abscess or fluid collection.  Assessment & Plan  Sepsis secondary to colitis/ Abdominal pain secondary to colitis/obstipation -Upon admission, patient was tachycardic with leukocytosis and elevated lactic acid -Leukocytosis and lactic acid improving -CT of the abdomen pelvis: Diarrheal state findings most compatible with colitis of the rectosigmoid. A small loculated fluid collection/abscess noted in the retrouterine space -Continue ceftriaxone and Flagyl -Gen. surgery consulted recommended IR for possible drain placement. -Spoke with IR 05/14/15, felt that  abscess/fluid collection was small for drain placement. Perhaps will resolve within the next couple of days. Repeat CT scan in several days. -Advance diet today  Liver mass -Noted on CT scan. Multiple enhancing hepatic lesions. Nonemergent MRI recommended. -MRI ordered for 3/27  Uterine fibroid -Patient will need gynecologic follow-up. Per patient has been long standing problem.  Hypokalemia -Resolved, continue to monitor BMP  Chronic constipation -Patient was taking bowel regimen at home -Able to have several BMs here  Hypoalbuminemia -Albumin 2.6 today  -Will consult nutrition -if patient is able tolerate diet, will add on nutritional supplements  Code Status: Full  Family Communication: Husband at bedside  Disposition Plan: Admitted.   Time Spent in minutes 30 minutes  Procedures  None  Consults  General surgery Interventional radiology   DVT Prophylaxis SCDs  Lab Results  Component Value Date   PLT 397 05/15/2015    Medications  Scheduled Meds: . bisacodyl  10 mg Rectal Daily  . cefTRIAXone (ROCEPHIN)  IV  2 g Intravenous Q24H  . fluconazole  200 mg Oral Daily  . lip balm  1 application Topical BID  . metronidazole  500 mg Intravenous Q6H  . pantoprazole (PROTONIX) IV  40 mg Intravenous Q24H  . piperacillin-tazobactam (ZOSYN)  IV  3.375 g Intravenous Q8H   Continuous Infusions: . 0.9 % NaCl with KCl 20 mEq / L 50 mL/hr at 05/15/15 1028   PRN Meds:.acetaminophen, acetaminophen, alum & mag hydroxide-simeth, diphenhydrAMINE, HYDROmorphone (DILAUDID) injection, HYDROmorphone (DILAUDID) injection, lactated ringers, magic mouthwash, menthol-cetylpyridinium, methocarbamol (ROBAXIN)  IV, metoprolol, ondansetron (ZOFRAN) IV, ondansetron, phenol, promethazine  Antibiotics    Anti-infectives    Start     Dose/Rate Route Frequency Ordered Stop   05/15/15 1030  fluconazole (DIFLUCAN) tablet 200 mg     200 mg Oral Daily 05/15/15 1025     05/14/15 1200   piperacillin-tazobactam (ZOSYN) IVPB 3.375 g     3.375 g 12.5 mL/hr over 240 Minutes Intravenous Every 8 hours 05/14/15 0748     05/14/15 0745  piperacillin-tazobactam (ZOSYN) IVPB 3.375 g  Status:  Discontinued     3.375 g 100 mL/hr over 30 Minutes Intravenous 3 times per day 05/14/15 0744 05/14/15 0747   05/14/15 0715  cefTRIAXone (ROCEPHIN) 2 g in dextrose 5 % 50 mL IVPB    Comments:  Pharmacy may adjust dosing strength / duration / interval for maximal efficacy   2 g 100 mL/hr over 30 Minutes Intravenous Every 24 hours 05/14/15 0702     05/14/15 0715  metroNIDAZOLE (FLAGYL) IVPB 500 mg     500 mg 100 mL/hr over 60 Minutes Intravenous Every 6 hours 05/14/15 0702     05/14/15 0345  piperacillin-tazobactam (ZOSYN) IVPB 3.375 g     3.375 g 100 mL/hr over 30 Minutes Intravenous  Once 05/14/15 [id number] 05/14/15 0546        Subjective:   Cheryl Grant seen and examined today.  Patient states she is feeling better today. Was able to tolerate liquids yesterday and had some bowel movements. Denies chest pain, shortness of breath, nausea or vomiting.  Objective:   Filed Vitals:   05/14/15 0700 05/14/15 1349 05/14/15 2134 05/15/15 0443  BP: 105/62 111/66 120/63 101/63  Pulse: 91 79 80 83  Temp: 97.8 F (36.6 C) 98.3 F (36.8 C) 98 F (36.7 C) 97.7 F (36.5 C)  TempSrc: Oral Oral Oral Oral  Resp: 20 20 18 16   SpO2: 99% 100% 100% 99%    Wt Readings from Last 3 Encounters:  No data found for Wt     Intake/Output Summary (Last 24 hours) at 05/15/15 1256 Last data filed at 05/15/15 0443  Gross per 24 hour  Intake    800 ml  Output    800 ml  Net      0 ml    Exam  General: Well developed, well nourished, NAD, appears stated age  HEENT: NCAT,  mucous membranes moist.   Cardiovascular: S1 S2 auscultated, no murmurs, RRR.  Respiratory: Clear to auscultation bilaterally   Abdomen: Soft, Mild TTP, nondistended, + bowel sounds  Extremities: warm dry without cyanosis  clubbing or edema  Neuro: AAOx3, nonfocal  Psych: Normal affect and demeanor   Data Review   Micro Results No results found for this or any previous visit (from the past 240 hour(s)).  Radiology Reports Ct Abdomen Pelvis W Contrast  05/14/2015  CLINICAL DATA:  46 year old female with abdominal pain standard in the left lower quadrant. Abdominal cramping and leukocytosis. EXAM: CT ABDOMEN AND PELVIS WITH CONTRAST TECHNIQUE: Multidetector CT imaging of the abdomen and pelvis was performed using the standard protocol following bolus administration of intravenous contrast. CONTRAST:  37mL OMNIPAQUE IOHEXOL 300 MG/ML SOLN, 144mL ISOVUE-300 IOPAMIDOL (ISOVUE-300) INJECTION 61% COMPARISON:  None FINDINGS: The  visualized lung bases are clear. No intra-abdominal free air. Trace free fluid within the pelvis. There multiple enhancing liver lesions. The largest lesion is located in the right lobe of the liver and measures approximately 5.0 x 4.0 cm and appears to have a central scar. Differentials include hemangioma, adenoma, focal nodular hyperplasia. Hepatocellular carcinoma or metastatic disease are not excluded. MRI without and with contrast is recommended for further characterization. There is a 2.3 x 1.4 cm hypodense lesion in the left lobe of the liver (segment IV B), possibly a cyst. Stop the gallbladder, pancreas, spleen, adrenal glands all, kidneys, visualized ureters, and urinary bladder appear unremarkable. The uterus is enlarged and myomatous. There is a 2.5 x 1.5 cm cystic structure in the region of the right ovary, likely a dominant follicle/cyst. There is a 2.5 x 4.6 cm loculated fluid collection in the cul-de-sac (pouch of [place]) most compatible with an abscess. There is apparent segmental thickening of the rectosigmoid wall with haziness and enhancement of the mucosa concerning for colitis. Loose stool noted within the colon compatible with diarrheal state. There is no evidence of bowel  obstruction. Normal appendix. The abdominal aorta and IVC appear unremarkable. No portal venous gas identified. There is no adenopathy. The abdominal wall soft tissues appear unremarkable. The osseous structures are intact. IMPRESSION: Diarrheal state with findings most compatible with colitis of the rectosigmoid. A small loculated fluid collection/abscess noted in the retrouterine space. Multiple enhancing hepatic lesions as described. Nonemergent MRI without and with contrast is recommended for further characterization. Enlarged and myomatous uterus. Right ovarian probable dominant follicle/cyst. Electronically Signed   By: Anner Crete M.D.   On: 05/14/2015 03:29    CBC  Recent Labs Lab 05/14/15 0035 05/15/15 0406  WBC 29.3* 20.6*  HGB 11.4* 9.6*  HCT 34.2* 29.2*  PLT 484* 397  MCV 86.1 87.2  MCH 28.7 28.7  MCHC 33.3 32.9  RDW 13.5 13.9  LYMPHSABS 1.2 1.7  MONOABS 0.9 1.2*  EOSABS 0.0 0.1  BASOSABS 0.0 0.0    Chemistries   Recent Labs Lab 05/14/15 0035 05/15/15 0406  NA 137 140  K 3.3* 3.8  CL 102 109  CO2 26 24  GLUCOSE 138* 100*  BUN 7 6  CREATININE 0.69 0.53  CALCIUM 8.7* 7.7*  MG  --  2.0  AST 16 12*  ALT 10* 8*  ALKPHOS 69 54  BILITOT 1.1 0.3   ------------------------------------------------------------------------------------------------------------------ CrCl cannot be calculated (Unknown ideal weight.). ------------------------------------------------------------------------------------------------------------------ No results for input(s): HGBA1C in the last 72 hours. ------------------------------------------------------------------------------------------------------------------ No results for input(s): CHOL, HDL, LDLCALC, TRIG, CHOLHDL, LDLDIRECT in the last 72 hours. ------------------------------------------------------------------------------------------------------------------ No results for input(s): TSH, T4TOTAL, T3FREE, THYROIDAB in the  last 72 hours.  Invalid input(s): FREET3 ------------------------------------------------------------------------------------------------------------------ No results for input(s): VITAMINB12, FOLATE, FERRITIN, TIBC, IRON, RETICCTPCT in the last 72 hours.  Coagulation profile No results for input(s): INR, PROTIME in the last 168 hours.  No results for input(s): DDIMER in the last 72 hours.  Cardiac Enzymes No results for input(s): CKMB, TROPONINI, MYOGLOBIN in the last 168 hours.  Invalid input(s): CK ------------------------------------------------------------------------------------------------------------------ Invalid input(s): POCBNP    Tora Prunty D.O. on 05/15/2015 at 12:56 PM  Between 7am to 7pm - Pager - [phone]  After 7pm go to [email or web address] - password [email or web address]  And look for the night coverage person covering for me after hours  [facility] Office  [phone]

## 2015-05-16 ENCOUNTER — Inpatient Hospital Stay (HOSPITAL_COMMUNITY): Payer: Self-pay

## 2015-05-16 DIAGNOSIS — D259 Leiomyoma of uterus, unspecified: Secondary | ICD-10-CM

## 2015-05-16 LAB — CBC WITH DIFFERENTIAL/PLATELET
BASOS PCT: 0 %
Basophils Absolute: 0 10*3/uL (ref 0.0–0.1)
EOS PCT: 1 %
Eosinophils Absolute: 0.1 10*3/uL (ref 0.0–0.7)
HCT: 30.6 % — ABNORMAL LOW (ref 36.0–46.0)
HEMOGLOBIN: 10 g/dL — AB (ref 12.0–15.0)
Lymphocytes Relative: 13 %
Lymphs Abs: 1.9 10*3/uL (ref 0.7–4.0)
MCH: 28.4 pg (ref 26.0–34.0)
MCHC: 32.7 g/dL (ref 30.0–36.0)
MCV: 86.9 fL (ref 78.0–100.0)
MONO ABS: 0.9 10*3/uL (ref 0.1–1.0)
Monocytes Relative: 6 %
NEUTROS ABS: 11.8 10*3/uL — AB (ref 1.7–7.7)
NEUTROS PCT: 80 %
PLATELETS: 448 10*3/uL — AB (ref 150–400)
RBC: 3.52 MIL/uL — ABNORMAL LOW (ref 3.87–5.11)
RDW: 14 % (ref 11.5–15.5)
WBC: 14.7 10*3/uL — ABNORMAL HIGH (ref 4.0–10.5)

## 2015-05-16 LAB — COMPREHENSIVE METABOLIC PANEL
ALK PHOS: 56 U/L (ref 38–126)
ALT: 8 U/L — AB (ref 14–54)
ANION GAP: 8 (ref 5–15)
AST: 11 U/L — ABNORMAL LOW (ref 15–41)
Albumin: 2.8 g/dL — ABNORMAL LOW (ref 3.5–5.0)
BUN: <5 mg/dL — ABNORMAL LOW (ref 6–20)
CALCIUM: 7.9 mg/dL — AB (ref 8.9–10.3)
CO2: 23 mmol/L (ref 22–32)
CREATININE: 0.57 mg/dL (ref 0.44–1.00)
Chloride: 108 mmol/L (ref 101–111)
Glucose, Bld: 92 mg/dL (ref 65–99)
Potassium: 3.5 mmol/L (ref 3.5–5.1)
Sodium: 139 mmol/L (ref 135–145)
Total Bilirubin: 0.5 mg/dL (ref 0.3–1.2)
Total Protein: 6.3 g/dL — ABNORMAL LOW (ref 6.5–8.1)

## 2015-05-16 LAB — PROTIME-INR
INR: 1.25 (ref 0.00–1.49)
PROTHROMBIN TIME: 15.4 s — AB (ref 11.6–15.2)

## 2015-05-16 MED ORDER — GADOBENATE DIMEGLUMINE 529 MG/ML IV SOLN
15.0000 mL | Freq: Once | INTRAVENOUS | Status: AC | PRN
  Administered 2015-05-16: 12 mL via INTRAVENOUS

## 2015-05-16 MED ORDER — MIDAZOLAM HCL 2 MG/2ML IJ SOLN
INTRAMUSCULAR | Status: AC
  Filled 2015-05-16: qty 6

## 2015-05-16 MED ORDER — FENTANYL CITRATE (PF) 100 MCG/2ML IJ SOLN
INTRAMUSCULAR | Status: AC | PRN
  Administered 2015-05-16: 50 ug via INTRAVENOUS
  Administered 2015-05-16 (×2): 25 ug via INTRAVENOUS

## 2015-05-16 MED ORDER — FENTANYL CITRATE (PF) 100 MCG/2ML IJ SOLN
INTRAMUSCULAR | Status: AC
  Filled 2015-05-16: qty 4

## 2015-05-16 MED ORDER — MIDAZOLAM HCL 2 MG/2ML IJ SOLN
INTRAMUSCULAR | Status: AC | PRN
  Administered 2015-05-16 (×5): 1 mg via INTRAVENOUS

## 2015-05-16 MED ORDER — MEGESTROL ACETATE 40 MG PO TABS
80.0000 mg | ORAL_TABLET | Freq: Two times a day (BID) | ORAL | Status: DC
  Administered 2015-05-16 – 2015-05-18 (×3): 80 mg via ORAL
  Filled 2015-05-16 (×6): qty 2

## 2015-05-16 NOTE — Sedation Documentation (Signed)
Patient denies pain and is resting comfortably.  

## 2015-05-16 NOTE — Progress Notes (Signed)
Patient ID: Cheryl Grant, female   DOB: 06/26/1970, 45 y.o.   MRN: [id number]     [facility]      [address] [place], [place] [address]    Phone: [phone] FAX: [phone]     Subjective: Having diarrhea and menorrhagia. WBC down to 14.7k.  Afebrile.   Tolerated fulls.  Objective:  Vital signs:  Filed Vitals:   05/15/15 0443 05/15/15 1350 05/15/15 2118 05/16/15 0556  BP: 101/63 113/68 111/74 116/74  Pulse: 83 79 92 82  Temp: 97.7 F (36.5 C) 98.8 F (37.1 C) 98.8 F (37.1 C) 98 F (36.7 C)  TempSrc: Oral Oral Oral Oral  Resp: '16 20 16 16  ' SpO2: 99% 100% 100% 100%    Last BM Date: 05/16/15  Intake/Output   Yesterday:  03/26 0701 - 03/27 0700 In: 3283.3 [P.O.:360; I.V.:2873.3; IV Piggyback:50] Out: 950 [Urine:950] This shift: I/O last 3 completed shifts: In: 3283.3 [P.O.:360; I.V.:2873.3; IV Piggyback:50] Out: 7517 [Urine:1750]    Physical Exam: General: Pt awake/alert/oriented x4 in no  acute distress  Abdomen: Soft.  distended.  Non tender.  No evidence of peritonitis.  No incarcerated hernias.    Problem List:   Principal Problem:   Colitis Active Problems:   Liver masses   Pelvic fluid collection   Fibroid uterus   Hypokalemia   Constipation, chronic   Protein-calorie malnutrition ([place])    Results:   Labs: Results for orders placed or performed during the hospital encounter of 05/13/15 (from the past 48 hour(s))  CBC WITH DIFFERENTIAL     Status: Abnormal   Collection Time: 05/15/15  4:06 AM  Result Value Ref Range   WBC 20.6 (H) 4.0 - 10.5 K/uL   RBC 3.35 (L) 3.87 - 5.11 MIL/uL   Hemoglobin 9.6 (L) 12.0 - 15.0 g/dL   HCT 29.2 (L) 36.0 - 46.0 %   MCV 87.2 78.0 - 100.0 fL   MCH 28.7 26.0 - 34.0 pg   MCHC 32.9 30.0 - 36.0 g/dL   RDW 13.9 11.5 - 15.5 %   Platelets 397 150 - 400 K/uL   Neutrophils Relative % 86 %   Neutro Abs 17.5 (H) 1.7 - 7.7 K/uL   Lymphocytes Relative 8 %    Lymphs Abs 1.7 0.7 - 4.0 K/uL   Monocytes Relative 6 %   Monocytes Absolute 1.2 (H) 0.1 - 1.0 K/uL   Eosinophils Relative 0 %   Eosinophils Absolute 0.1 0.0 - 0.7 K/uL   Basophils Relative 0 %   Basophils Absolute 0.0 0.0 - 0.1 K/uL  Comprehensive metabolic panel     Status: Abnormal   Collection Time: 05/15/15  4:06 AM  Result Value Ref Range   Sodium 140 135 - 145 mmol/L   Potassium 3.8 3.5 - 5.1 mmol/L   Chloride 109 101 - 111 mmol/L   CO2 24 22 - 32 mmol/L   Glucose, Bld 100 (H) 65 - 99 mg/dL   BUN 6 6 - 20 mg/dL   Creatinine, Ser 0.53 0.44 - 1.00 mg/dL   Calcium 7.7 (L) 8.9 - 10.3 mg/dL   Total Protein 6.2 (L) 6.5 - 8.1 g/dL   Albumin 2.6 (L) 3.5 - 5.0 g/dL   AST 12 (L) 15 - 41 U/L   ALT 8 (L) 14 - 54 U/L   Alkaline Phosphatase 54 38 - 126 U/L   Total Bilirubin 0.3 0.3 - 1.2 mg/dL   GFR calc non Af Amer >  60 >60 mL/min   GFR calc Af Amer >60 >60 mL/min    Comment: (NOTE) The eGFR has been calculated using the CKD EPI equation. This calculation has not been validated in all clinical situations. eGFR's persistently <60 mL/min signify possible Chronic Kidney Disease.    Anion gap 7 5 - 15  Magnesium     Status: None   Collection Time: 05/15/15  4:06 AM  Result Value Ref Range   Magnesium 2.0 1.7 - 2.4 mg/dL  Phosphorus     Status: Abnormal   Collection Time: 05/15/15  4:06 AM  Result Value Ref Range   Phosphorus 1.9 (L) 2.5 - 4.6 mg/dL  Prealbumin     Status: Abnormal   Collection Time: 05/15/15 11:03 AM  Result Value Ref Range   Prealbumin 6.5 (L) 18 - 38 mg/dL    Comment: Performed at [facility]  CBC WITH DIFFERENTIAL     Status: Abnormal   Collection Time: 05/16/15  4:38 AM  Result Value Ref Range   WBC 14.7 (H) 4.0 - 10.5 K/uL    Comment: WHITE COUNT CONFIRMED ON SMEAR   RBC 3.52 (L) 3.87 - 5.11 MIL/uL   Hemoglobin 10.0 (L) 12.0 - 15.0 g/dL   HCT 30.6 (L) 36.0 - 46.0 %   MCV 86.9 78.0 - 100.0 fL   MCH 28.4 26.0 - 34.0 pg   MCHC 32.7 30.0 - 36.0  g/dL   RDW 14.0 11.5 - 15.5 %   Platelets 448 (H) 150 - 400 K/uL   Neutrophils Relative % 80 %   Lymphocytes Relative 13 %   Monocytes Relative 6 %   Eosinophils Relative 1 %   Basophils Relative 0 %   Neutro Abs 11.8 (H) 1.7 - 7.7 K/uL   Lymphs Abs 1.9 0.7 - 4.0 K/uL   Monocytes Absolute 0.9 0.1 - 1.0 K/uL   Eosinophils Absolute 0.1 0.0 - 0.7 K/uL   Basophils Absolute 0.0 0.0 - 0.1 K/uL   Smear Review MORPHOLOGY UNREMARKABLE   Comprehensive metabolic panel     Status: Abnormal   Collection Time: 05/16/15  4:38 AM  Result Value Ref Range   Sodium 139 135 - 145 mmol/L   Potassium 3.5 3.5 - 5.1 mmol/L   Chloride 108 101 - 111 mmol/L   CO2 23 22 - 32 mmol/L   Glucose, Bld 92 65 - 99 mg/dL   BUN <5 (L) 6 - 20 mg/dL   Creatinine, Ser 0.57 0.44 - 1.00 mg/dL   Calcium 7.9 (L) 8.9 - 10.3 mg/dL   Total Protein 6.3 (L) 6.5 - 8.1 g/dL   Albumin 2.8 (L) 3.5 - 5.0 g/dL   AST 11 (L) 15 - 41 U/L   ALT 8 (L) 14 - 54 U/L   Alkaline Phosphatase 56 38 - 126 U/L   Total Bilirubin 0.5 0.3 - 1.2 mg/dL   GFR calc non Af Amer >60 >60 mL/min   GFR calc Af Amer >60 >60 mL/min    Comment: (NOTE) The eGFR has been calculated using the CKD EPI equation. This calculation has not been validated in all clinical situations. eGFR's persistently <60 mL/min signify possible Chronic Kidney Disease.    Anion gap 8 5 - 15    Imaging / Studies: No results found.  Medications / Allergies:  Scheduled Meds: . bisacodyl  10 mg Rectal Daily  . cefTRIAXone (ROCEPHIN)  IV  2 g Intravenous Q24H  . fluconazole  200 mg Oral Daily  . lip balm  1 application Topical BID  . metronidazole  500 mg Intravenous Q6H  . pantoprazole (PROTONIX) IV  40 mg Intravenous Q24H   Continuous Infusions: . 0.9 % NaCl with KCl 20 mEq / L 50 mL/hr at 05/16/15 0700   PRN Meds:.acetaminophen, acetaminophen, alum & mag hydroxide-simeth, diphenhydrAMINE, HYDROmorphone (DILAUDID) injection, HYDROmorphone (DILAUDID) injection, lactated  ringers, magic mouthwash, menthol-cetylpyridinium, methocarbamol (ROBAXIN)  IV, metoprolol, ondansetron (ZOFRAN) IV, ondansetron, phenol, promethazine  Antibiotics: Anti-infectives    Start     Dose/Rate Route Frequency Ordered Stop   05/15/15 1030  fluconazole (DIFLUCAN) tablet 200 mg     200 mg Oral Daily 05/15/15 1025     05/14/15 1200  piperacillin-tazobactam (ZOSYN) IVPB 3.375 g  Status:  Discontinued     3.375 g 12.5 mL/hr over 240 Minutes Intravenous Every 8 hours 05/14/15 0748 05/15/15 1458   05/14/15 0745  piperacillin-tazobactam (ZOSYN) IVPB 3.375 g  Status:  Discontinued     3.375 g 100 mL/hr over 30 Minutes Intravenous 3 times per day 05/14/15 0744 05/14/15 0747   05/14/15 0715  cefTRIAXone (ROCEPHIN) 2 g in dextrose 5 % 50 mL IVPB    Comments:  Pharmacy may adjust dosing strength / duration / interval for maximal efficacy   2 g 100 mL/hr over 30 Minutes Intravenous Every 24 hours 05/14/15 0702     05/14/15 0715  metroNIDAZOLE (FLAGYL) IVPB 500 mg     500 mg 100 mL/hr over 60 Minutes Intravenous Every 6 hours 05/14/15 0702     05/14/15 0345  piperacillin-tazobactam (ZOSYN) IVPB 3.375 g     3.375 g 100 mL/hr over 30 Minutes Intravenous  Once 05/14/15 0300 05/14/15 0546        Assessment/Plan Colitis  2.5x4.6 fluid collection retrouterine space -not amendable to drainage.   -Continue with IV antibiotics.   -probably needs an interval colonoscopy  -non surgical abdomen.  Will need GYN follow up  -may resume soft diet post MRI  Liver mass Uterine fibroids  -MRI of liver today   Erby Pian, ANP-BC [facility] Pager [phone](7A-4:30P)   05/16/2015 9:53 AM   I have seen and examined this patient and agree with the assessment and plan.   Matt B. Hassell Done, MD, [facility] [phone] beeper [phone]  05/18/2015 7:52 AM

## 2015-05-16 NOTE — Progress Notes (Signed)
Initial Nutrition Assessment  INTERVENTION:   Recommend obtaining Height/Weight measurements so RD can complete assessment.  RD to follow-up to speak with pt/family.  NUTRITION DIAGNOSIS:   Diagnosis to be determined once assessment complete.  GOAL:   Patient will meet greater than or equal to 90% of their needs  MONITOR:   PO intake, Labs, Weight trends, I & O's  REASON FOR ASSESSMENT:   Consult Assessment of nutrition requirement/status  ASSESSMENT:   45 y.o. female with no past medical history who comes to emergency department due to abdominal pain since early morning yesterday morning associated with constipation.  Patient was not in room when RD visited. Per chart, she is having a MRI of liver. Pt with no past medical history, she was found to have liver and pelvic masses. Per H&P, admitted with constipation which has caused abdominal pain for 1 day PTA. She has since had BMs. She is on a soft diet and consuming ~25%.  No height or weight is available in chart. Unable to calculate needs and diagnose without these measurements.  Will attempt NFPE once able to see patient.   Medications: NaCl w/ KCl infusion @ 50 ml/hr, Magic Mouthwash PRN Labs reviewed: Low Phos Mg/K WNL  Diet Order:  DIET SOFT Room service appropriate?: Yes; Fluid consistency:: Thin  Skin:  Reviewed, no issues  Last BM:  3/27  Height:   Ht Readings from Last 1 Encounters:  No data found for Ht    Weight:   Wt Readings from Last 1 Encounters:  No data found for Wt    Ideal Body Weight:   (unable to calculate at this time)  BMI:  There is no height or weight on file to calculate BMI.  Estimated Nutritional Needs:   Kcal:  unable to calculate at this time  Protein:  "  Fluid:  "  EDUCATION NEEDS:   No education needs identified at this time  Clayton Bibles, MS, RD, LDN Pager: [phone] After Hours Pager: [phone]

## 2015-05-16 NOTE — Consult Note (Signed)
Chief Complaint: Patient was seen in consultation today for CT guided pelvic fluid collection drainage  Referring Physician(s): Cristal Ford  Supervising Physician: Marybelle Killings  History of Present Illness: Cheryl Grant is a 45 y.o. female with no significant PMH who was recently admitted with abdominal pain, nausea, constipation, fevers, back pain and leukocytosis. Subsequent imaging revealed findings c/w colitis with loculated fluid collection/abscess in retrouterine space. Also noted were hepatic lesions and fibroid uterus. Pt has had menorrhagia associated with uterine fibroids. 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 Request received for CT guided drainage of the pelvic fluid collection/abscess.   Past Medical History  Diagnosis Date  . Uterine fibroid     Past Surgical History  Procedure Laterality Date  . Cesarean section  2003    Primary cesarean section, Kroneg incision  . Robot assisted myomectomy  2007    Dr Dellis Filbert    Allergies: Ibuprofen  Medications: Prior to Admission medications   Not on File     History reviewed. No pertinent family history.  Social History   Social History  . Marital Status: Single    Spouse Name: N/A  . Number of Children: N/A  . Years of Education: N/A   Social History Main Topics  . Smoking status: Never Smoker   . Smokeless tobacco: None  . Alcohol Use: Yes     Comment: ocassional  . Drug Use: No  . Sexual Activity: Not Asked   Other Topics Concern  . None   Social History Narrative  . None      Review of Systems  See above; currently denies fever, HA, CP, dyspnea, cough; does c/o mild rt lat abd discomfort, abd fullness, loose stools, menorrhagia  Vital Signs: BP 119/80 mmHg  Pulse 67  Temp(Src) 98.1 F (36.7 C) (Oral)  Resp 18  Ht 5' (1.524 m)  Wt 124 lb  (56.246 kg)  BMI 24.22 kg/m2  SpO2 100%  LMP 05/04/2015  Physical Exam awake/alert; chest- sl dim BS rt base, left clear; heart- RRR; abd- soft, +BS, sl dist; mild RUQ/LLQ tenderness; LE- no edema  Mallampati Score:     Imaging: Mr Pelvis W Wo Contrast  05/16/2015  CLINICAL DATA:  Evaluate previously noted liver lesions. Uterine fibroids. Evaluate for sarcoma. EXAM: MRI ABDOMEN AND PELVIS WITHOUT CONTRAST TECHNIQUE: Multiplanar multisequence MR imaging of the abdomen and pelvis was performed. No intravenous contrast was administered. COMPARISON:  05/14/2015 FINDINGS: MRI ABDOMEN FINDINGS Lower chest: Small pleural effusions are noted right greater than left. Hepatobiliary: There are multiple arterial phase enhancing liver lesions identified within both lobes. The largest is in the posterior right lobe of liver measuring 4.3 cm, image 18 of series 1901. This demonstrates avid arterial enhancement. Multiple arterial phase enhancing lesions are also noted within the left lobe. The largest measures 3.2 cm, image 31 of series 1901. Nonenhancing T2 hyperintense structure within the medial segment of left lobe of liver is favored to represent a simple cyst. This measures 1.9 cm, image 25 of series 3. The gallbladder appears normal. No biliary dilatation. Pancreas:  The pancreas appears normal. Spleen:  Within normal limits in size and appearance. Adrenals/Urinary Tract: No masses identified. No evidence of hydronephrosis. Stomach/Bowel: Visualized portions within the abdomen are unremarkable. Vascular/Lymphatic: No pathologically enlarged lymph nodes identified. No abdominal aortic aneurysm demonstrated. Other:  None. Musculoskeletal:  No suspicious bone lesions  identified. MRI PELVIS FINDINGS Urinary Tract: The urinary bladder appears normal. Bowel: Wall thickening and inflammation involving the sigmoid colon is again noted, image 27 of series 13. Fluid collection within the pelvis ventral to the rectum  measures 4.2 cm, image 32 of series 13. This is compared with 3.4 cm previously. Vascular/Lymphatic: The iliac vasculature is unremarkable. No adenopathy. Reproductive: Enlarged fibroid uterus is identified. This measures 12.1 x 8.1 x 17.3 cm. This has a volume of 847 cc. There are multiple fibroids in varying locations including subserosal, intramural and submucosal. There is fairly uniform enhancement of these fibroids following the IV administration of contrast. The largest fibroid is subserosal arising from the anterior lower uterine segment and measures 5.6 cm, image 14 of series 14. Arising from the left side of uterus is 86.1 cm fibroid, image 19 of series 11. This is also partially sub serosal. The endometrium is difficult to visualize due to distortion by fibroids. The cervix appears dilated and fluid-filled, image 17 of series 11. Cystic structure within the right adnexa measures 2.4 cm, image 14 of series 11. This is favored to represent an ovarian cyst. Several small follicles are noted within the left ovary, image 24 series 11. Other: Trace fluid identified within the pelvis. Musculoskeletal: Normal signal identified from within the bone marrow. IMPRESSION: 1. Multifocal arterial phase enhancing lesions are identified within both lobes of liver. In a patient that is at low risk for malignancy these are favored to represent either multiple [place] is versus liver adenomas. Hypervascular liver metastases or primary liver malignancy is considered less favored. Differentiation of FNH from adenoma is recommended for lesions greater than 4 cm. In a patient that is at low risk for either primary liver malignancy or metastatic disease 3 to six-month followup imaging with MRI of the liver performed with Gretta Cool is advised. 2. Enlarged uterus containing multiple enhancing masses. Statistically these likely represent multiple benign uterine fibroids. 3. Imaging findings compatible with sigmoid colitis. 4. Persistent  pelvic fluid collection, likely abscess secondary to colitis. When compared with recent CT of the pelvis this fluid collection appears slightly increased in size in the interval. Electronically Signed   By: Kerby Moors M.D.   On: 05/16/2015 12:01   Mr Abdomen W Wo Contrast  05/16/2015  CLINICAL DATA:  Evaluate previously noted liver lesions. Uterine fibroids. Evaluate for sarcoma. EXAM: MRI ABDOMEN AND PELVIS WITHOUT CONTRAST TECHNIQUE: Multiplanar multisequence MR imaging of the abdomen and pelvis was performed. No intravenous contrast was administered. COMPARISON:  05/14/2015 FINDINGS: MRI ABDOMEN FINDINGS Lower chest: Small pleural effusions are noted right greater than left. Hepatobiliary: There are multiple arterial phase enhancing liver lesions identified within both lobes. The largest is in the posterior right lobe of liver measuring 4.3 cm, image 18 of series 1901. This demonstrates avid arterial enhancement. Multiple arterial phase enhancing lesions are also noted within the left lobe. The largest measures 3.2 cm, image 31 of series 1901. Nonenhancing T2 hyperintense structure within the medial segment of left lobe of liver is favored to represent a simple cyst. This measures 1.9 cm, image 25 of series 3. The gallbladder appears normal. No biliary dilatation. Pancreas:  The pancreas appears normal. Spleen:  Within normal limits in size and appearance. Adrenals/Urinary Tract: No masses identified. No evidence of hydronephrosis. Stomach/Bowel: Visualized portions within the abdomen are unremarkable. Vascular/Lymphatic: No pathologically enlarged lymph nodes identified. No abdominal aortic aneurysm demonstrated. Other:  None. Musculoskeletal:  No suspicious bone lesions identified. MRI PELVIS FINDINGS Urinary Tract: The  urinary bladder appears normal. Bowel: Wall thickening and inflammation involving the sigmoid colon is again noted, image 27 of series 13. Fluid collection within the pelvis ventral to  the rectum measures 4.2 cm, image 32 of series 13. This is compared with 3.4 cm previously. Vascular/Lymphatic: The iliac vasculature is unremarkable. No adenopathy. Reproductive: Enlarged fibroid uterus is identified. This measures 12.1 x 8.1 x 17.3 cm. This has a volume of 847 cc. There are multiple fibroids in varying locations including subserosal, intramural and submucosal. There is fairly uniform enhancement of these fibroids following the IV administration of contrast. The largest fibroid is subserosal arising from the anterior lower uterine segment and measures 5.6 cm, image 14 of series 14. Arising from the left side of uterus is 86.1 cm fibroid, image 19 of series 11. This is also partially sub serosal. The endometrium is difficult to visualize due to distortion by fibroids. The cervix appears dilated and fluid-filled, image 17 of series 11. Cystic structure within the right adnexa measures 2.4 cm, image 14 of series 11. This is favored to represent an ovarian cyst. Several small follicles are noted within the left ovary, image 24 series 11. Other: Trace fluid identified within the pelvis. Musculoskeletal: Normal signal identified from within the bone marrow. IMPRESSION: 1. Multifocal arterial phase enhancing lesions are identified within both lobes of liver. In a patient that is at low risk for malignancy these are favored to represent either multiple [place] is versus liver adenomas. Hypervascular liver metastases or primary liver malignancy is considered less favored. Differentiation of FNH from adenoma is recommended for lesions greater than 4 cm. In a patient that is at low risk for either primary liver malignancy or metastatic disease 3 to six-month followup imaging with MRI of the liver performed with Gretta Cool is advised. 2. Enlarged uterus containing multiple enhancing masses. Statistically these likely represent multiple benign uterine fibroids. 3. Imaging findings compatible with sigmoid colitis. 4.  Persistent pelvic fluid collection, likely abscess secondary to colitis. When compared with recent CT of the pelvis this fluid collection appears slightly increased in size in the interval. Electronically Signed   By: Kerby Moors M.D.   On: 05/16/2015 12:01   Ct Abdomen Pelvis W Contrast  05/14/2015  CLINICAL DATA:  45 year old female with abdominal pain standard in the left lower quadrant. Abdominal cramping and leukocytosis. EXAM: CT ABDOMEN AND PELVIS WITH CONTRAST TECHNIQUE: Multidetector CT imaging of the abdomen and pelvis was performed using the standard protocol following bolus administration of intravenous contrast. CONTRAST:  52mL OMNIPAQUE IOHEXOL 300 MG/ML SOLN, 111mL ISOVUE-300 IOPAMIDOL (ISOVUE-300) INJECTION 61% COMPARISON:  None FINDINGS: The visualized lung bases are clear. No intra-abdominal free air. Trace free fluid within the pelvis. There multiple enhancing liver lesions. The largest lesion is located in the right lobe of the liver and measures approximately 5.0 x 4.0 cm and appears to have a central scar. Differentials include hemangioma, adenoma, focal nodular hyperplasia. Hepatocellular carcinoma or metastatic disease are not excluded. MRI without and with contrast is recommended for further characterization. There is a 2.3 x 1.4 cm hypodense lesion in the left lobe of the liver (segment IV B), possibly a cyst. Stop the gallbladder, pancreas, spleen, adrenal glands all, kidneys, visualized ureters, and urinary bladder appear unremarkable. The uterus is enlarged and myomatous. There is a 2.5 x 1.5 cm cystic structure in the region of the right ovary, likely a dominant follicle/cyst. There is a 2.5 x 4.6 cm loculated fluid collection in the cul-de-sac (pouch of [place])  most compatible with an abscess. There is apparent segmental thickening of the rectosigmoid wall with haziness and enhancement of the mucosa concerning for colitis. Loose stool noted within the colon compatible with  diarrheal state. There is no evidence of bowel obstruction. Normal appendix. The abdominal aorta and IVC appear unremarkable. No portal venous gas identified. There is no adenopathy. The abdominal wall soft tissues appear unremarkable. The osseous structures are intact. IMPRESSION: Diarrheal state with findings most compatible with colitis of the rectosigmoid. A small loculated fluid collection/abscess noted in the retrouterine space. Multiple enhancing hepatic lesions as described. Nonemergent MRI without and with contrast is recommended for further characterization. Enlarged and myomatous uterus. Right ovarian probable dominant follicle/cyst. Electronically Signed   By: Anner Crete M.D.   On: 05/14/2015 03:29    Labs:  CBC:  Recent Labs  05/14/15 0035 05/15/15 0406 05/16/15 0438  WBC 29.3* 20.6* 14.7*  HGB 11.4* 9.6* 10.0*  HCT 34.2* 29.2* 30.6*  PLT 484* 397 448*    COAGS: No results for input(s): INR, APTT in the last 8760 hours.  BMP:  Recent Labs  05/14/15 0035 05/15/15 0406 05/16/15 0438  NA 137 140 139  K 3.3* 3.8 3.5  CL 102 109 108  CO2 26 24 23   GLUCOSE 138* 100* 92  BUN 7 6 <5*  CALCIUM 8.7* 7.7* 7.9*  CREATININE 0.69 0.53 0.57  GFRNONAA >60 >60 >60  GFRAA >60 >60 >60    LIVER FUNCTION TESTS:  Recent Labs  05/14/15 0035 05/15/15 0406 05/16/15 0438  BILITOT 1.1 0.3 0.5  AST 16 12* 11*  ALT 10* 8* 8*  ALKPHOS 69 54 56  PROT 7.7 6.2* 6.3*  ALBUMIN 3.4* 2.6* 2.8*    TUMOR MARKERS: No results for input(s): AFPTM, CEA, CA199, CHROMGRNA in the last 8760 hours.  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 Request received for CT guided drainage of pelvic fluid collection.  Imaging studies were reviewed by Dr. Barbie Banner and collection appears amenable to drainage .Risks and benefits discussed with the patient including bleeding, infection, damage to adjacent structures, bowel perforation/fistula connection, and sepsis.All of the patient's questions were answered, patient is agreeable to proceed.Consent signed and in chart. Procedure planned for later today.      Thank you for this interesting consult.  I greatly enjoyed meeting Cheryl Grant and look forward to participating in their care.  A copy of this report was sent to the requesting provider on this date.  Electronically Signed: D. Rowe Robert 05/16/2015, 3:10 PM   I spent a total of 40 minutes in face to face in clinical consultation, greater than 50% of which was counseling/coordinating care for CT guided pelvic fluid collection drainage

## 2015-05-16 NOTE — Progress Notes (Signed)
[facility]                                                                              Patient Demographics  Cheryl Grant, is a 45 y.o. female, DOB - May 18, 1970, [id number]  Admit date - 05/13/2015   Admitting Physician Reubin Milan, MD  Outpatient Primary MD for the patient is No primary care provider on file.  LOS - 2   No chief complaint on file.     HPI on 05/14/2015 by Dr. Gerri Lins Cheryl Grant is a 45 y.o. female with no past medical history who comes to emergency department due to abdominal pain since early morning yesterday morning associated with constipation.  Per patient, she woke up yesterday around 6 in the morning with intense left lower quadrant pain. She states that she felt like she had an obstruction, because she was unable to have a bowel movement. She said that she took 3 different types of medications for constipation and had a bowel movement around 3 PM showing very hard stools. The patient stated that she felt like she was passing a rock. Associated symptoms have been abdominal distention, 3 episodes of nausea with emesis and fever.  When seen in the emergency department, the patient stated that she felt a lot better after she was given analgesics. Workup shows leukocytosis of 20 9.3K and a CT scan of the abdomen showing findings consistent with colitis and the retro-uterine abscess or fluid collection.  Assessment & Plan  Sepsis secondary to colitis/ Abdominal pain secondary to colitis/obstipation -Upon admission, patient was tachycardic with leukocytosis and elevated lactic acid -Leukocytosis and lactic acid improving -CT of the abdomen pelvis: Diarrheal state findings most compatible with colitis of the rectosigmoid. A small loculated fluid collection/abscess noted in the retrouterine space -Continue ceftriaxone and Flagyl -Gen. surgery consulted recommended IR for possible drain placement. -Spoke with IR 05/14/15, felt that  abscess/fluid collection was small for drain placement. Perhaps will resolve within the next couple of days. Repeat CT scan in several days. -Spoke with Dr. Clovis Riley, felt abscess/fluid collection is increasing in size on MRI -Reconsulted IR for possible drain -Advance diet today -Patient will need colonoscopy after colitis resolves.  Liver mass -Noted on CT scan. Multiple enhancing hepatic lesions. Nonemergent MRI recommended. -MRI abd/pelvis: possible focal nodular hyperplasia vs adenoma.  Recommended 3-6 month MRI with Eovist for follow up  Uterine fibroid -Patient will need gynecologic follow-up. Per patient has been long standing problem. -Bleeding, however, hemoglobin stable -Patient states she was having regular periods until 3-4 months ago, then started having periods twice/month -Spoke with Dr. Nehemiah Settle (Gyn) recommended outpatient follow and starting megace 80mg  BID until seen by gynecology  Hypokalemia -Resolved, continue to monitor BMP  Chronic constipation -Patient was taking bowel regimen at home -Able to have several BMs here  Hypoalbuminemia -Albumin 2.6 today  -Nutrition consulted -if patient is able tolerate diet, will add on nutritional supplements  Code Status: Full  Family Communication: None at bedside  Disposition Plan: Admitted.   Time Spent in minutes 30 minutes  Procedures  None  Consults  General surgery Interventional radiology  Gynecology, via phone  DVT Prophylaxis SCDs  Lab Results  Component Value Date   PLT 448* 05/16/2015    Medications  Scheduled Meds: . bisacodyl  10 mg Rectal Daily  . cefTRIAXone (ROCEPHIN)  IV  2 g Intravenous Q24H  . fluconazole  200 mg Oral Daily  . lip balm  1 application Topical BID  . megestrol  80 mg Oral BID  . metronidazole  500 mg Intravenous Q6H  . pantoprazole (PROTONIX) IV  40 mg Intravenous Q24H   Continuous Infusions: . 0.9 % NaCl with KCl 20 mEq / L 50 mL/hr at 05/16/15 0700   PRN  Meds:.acetaminophen, acetaminophen, alum & mag hydroxide-simeth, diphenhydrAMINE, HYDROmorphone (DILAUDID) injection, HYDROmorphone (DILAUDID) injection, lactated ringers, magic mouthwash, menthol-cetylpyridinium, methocarbamol (ROBAXIN)  IV, metoprolol, ondansetron (ZOFRAN) IV, ondansetron, phenol, promethazine  Antibiotics    Anti-infectives    Start     Dose/Rate Route Frequency Ordered Stop   05/15/15 1030  fluconazole (DIFLUCAN) tablet 200 mg     200 mg Oral Daily 05/15/15 1025     05/14/15 1200  piperacillin-tazobactam (ZOSYN) IVPB 3.375 g  Status:  Discontinued     3.375 g 12.5 mL/hr over 240 Minutes Intravenous Every 8 hours 05/14/15 0748 05/15/15 1458   05/14/15 0745  piperacillin-tazobactam (ZOSYN) IVPB 3.375 g  Status:  Discontinued     3.375 g 100 mL/hr over 30 Minutes Intravenous 3 times per day 05/14/15 0744 05/14/15 0747   05/14/15 0715  cefTRIAXone (ROCEPHIN) 2 g in dextrose 5 % 50 mL IVPB    Comments:  Pharmacy may adjust dosing strength / duration / interval for maximal efficacy   2 g 100 mL/hr over 30 Minutes Intravenous Every 24 hours 05/14/15 0702     05/14/15 0715  metroNIDAZOLE (FLAGYL) IVPB 500 mg     500 mg 100 mL/hr over 60 Minutes Intravenous Every 6 hours 05/14/15 0702     05/14/15 0345  piperacillin-tazobactam (ZOSYN) IVPB 3.375 g     3.375 g 100 mL/hr over 30 Minutes Intravenous  Once 05/14/15 [id number] 05/14/15 0546        Subjective:   [name] seen and examined today.  Patient states she is feeling better but is having to go to the bathroom frequently.  She also complains of vaginal bleeding.  Denies chest pain, shortness of breath, nausea or vomiting.  Objective:   Filed Vitals:   05/15/15 1350 05/15/15 2118 05/16/15 0556 05/16/15 1200  BP: 113/68 111/74 116/74   Pulse: 79 92 82   Temp: 98.8 F (37.1 C) 98.8 F (37.1 C) 98 F (36.7 C)   TempSrc: Oral Oral Oral   Resp: 20 16 16    Height:    5' (1.524 m)  Weight:    56.246 kg (124 lb)    SpO2: 100% 100% 100%     Wt Readings from Last 3 Encounters:  05/16/15 56.246 kg (124 lb)     Intake/Output Summary (Last 24 hours) at 05/16/15 1303 Last data filed at 05/16/15 1200  Gross per 24 hour  Intake 3163.33 ml  Output   1250 ml  Net 1913.33 ml    Exam  General: Well developed, well nourished, NAD  HEENT: NCAT,  mucous membranes moist.   Cardiovascular: S1 S2 auscultated, no murmurs, RRR.  Respiratory: Clear to auscultation bilaterally   Abdomen: Soft, Mild TTP, nondistended, + bowel sounds, enlarged uterus  Extremities: warm dry without cyanosis clubbing or edema  Neuro: AAOx3, nonfocal  Psych: Normal affect and demeanor, pleasant  Data Review   Micro Results No  results found for this or any previous visit (from the past 240 hour(s)).  Radiology Reports Mr Pelvis W Wo Contrast  05/16/2015  CLINICAL DATA:  Evaluate previously noted liver lesions. Uterine fibroids. Evaluate for sarcoma. EXAM: MRI ABDOMEN AND PELVIS WITHOUT CONTRAST TECHNIQUE: Multiplanar multisequence MR imaging of the abdomen and pelvis was performed. No intravenous contrast was administered. COMPARISON:  05/14/2015 FINDINGS: MRI ABDOMEN FINDINGS Lower chest: Small pleural effusions are noted right greater than left. Hepatobiliary: There are multiple arterial phase enhancing liver lesions identified within both lobes. The largest is in the posterior right lobe of liver measuring 4.3 cm, image 18 of series 1901. This demonstrates avid arterial enhancement. Multiple arterial phase enhancing lesions are also noted within the left lobe. The largest measures 3.2 cm, image 31 of series 1901. Nonenhancing T2 hyperintense structure within the medial segment of left lobe of liver is favored to represent a simple cyst. This measures 1.9 cm, image 25 of series 3. The gallbladder appears normal. No biliary dilatation. Pancreas:  The pancreas appears normal. Spleen:  Within normal limits in size and appearance.  Adrenals/Urinary Tract: No masses identified. No evidence of hydronephrosis. Stomach/Bowel: Visualized portions within the abdomen are unremarkable. Vascular/Lymphatic: No pathologically enlarged lymph nodes identified. No abdominal aortic aneurysm demonstrated. Other:  None. Musculoskeletal:  No suspicious bone lesions identified. MRI PELVIS FINDINGS Urinary Tract: The urinary bladder appears normal. Bowel: Wall thickening and inflammation involving the sigmoid colon is again noted, image 27 of series 13. Fluid collection within the pelvis ventral to the rectum measures 4.2 cm, image 32 of series 13. This is compared with 3.4 cm previously. Vascular/Lymphatic: The iliac vasculature is unremarkable. No adenopathy. Reproductive: Enlarged fibroid uterus is identified. This measures 12.1 x 8.1 x 17.3 cm. This has a volume of 847 cc. There are multiple fibroids in varying locations including subserosal, intramural and submucosal. There is fairly uniform enhancement of these fibroids following the IV administration of contrast. The largest fibroid is subserosal arising from the anterior lower uterine segment and measures 5.6 cm, image 14 of series 14. Arising from the left side of uterus is 86.1 cm fibroid, image 19 of series 11. This is also partially sub serosal. The endometrium is difficult to visualize due to distortion by fibroids. The cervix appears dilated and fluid-filled, image 17 of series 11. Cystic structure within the right adnexa measures 2.4 cm, image 14 of series 11. This is favored to represent an ovarian cyst. Several small follicles are noted within the left ovary, image 24 series 11. Other: Trace fluid identified within the pelvis. Musculoskeletal: Normal signal identified from within the bone marrow. IMPRESSION: 1. Multifocal arterial phase enhancing lesions are identified within both lobes of liver. In a patient that is at low risk for malignancy these are favored to represent either multiple [place] is  versus liver adenomas. Hypervascular liver metastases or primary liver malignancy is considered less favored. Differentiation of FNH from adenoma is recommended for lesions greater than 4 cm. In a patient that is at low risk for either primary liver malignancy or metastatic disease 3 to six-month followup imaging with MRI of the liver performed with Gretta Cool is advised. 2. Enlarged uterus containing multiple enhancing masses. Statistically these likely represent multiple benign uterine fibroids. 3. Imaging findings compatible with sigmoid colitis. 4. Persistent pelvic fluid collection, likely abscess secondary to colitis. When compared with recent CT of the pelvis this fluid collection appears slightly increased in size in the interval. Electronically Signed   By: Lovena Le  Clovis Riley M.D.   On: 05/16/2015 12:01   Mr Abdomen W Wo Contrast  05/16/2015  CLINICAL DATA:  Evaluate previously noted liver lesions. Uterine fibroids. Evaluate for sarcoma. EXAM: MRI ABDOMEN AND PELVIS WITHOUT CONTRAST TECHNIQUE: Multiplanar multisequence MR imaging of the abdomen and pelvis was performed. No intravenous contrast was administered. COMPARISON:  05/14/2015 FINDINGS: MRI ABDOMEN FINDINGS Lower chest: Small pleural effusions are noted right greater than left. Hepatobiliary: There are multiple arterial phase enhancing liver lesions identified within both lobes. The largest is in the posterior right lobe of liver measuring 4.3 cm, image 18 of series 1901. This demonstrates avid arterial enhancement. Multiple arterial phase enhancing lesions are also noted within the left lobe. The largest measures 3.2 cm, image 31 of series 1901. Nonenhancing T2 hyperintense structure within the medial segment of left lobe of liver is favored to represent a simple cyst. This measures 1.9 cm, image 25 of series 3. The gallbladder appears normal. No biliary dilatation. Pancreas:  The pancreas appears normal. Spleen:  Within normal limits in size and  appearance. Adrenals/Urinary Tract: No masses identified. No evidence of hydronephrosis. Stomach/Bowel: Visualized portions within the abdomen are unremarkable. Vascular/Lymphatic: No pathologically enlarged lymph nodes identified. No abdominal aortic aneurysm demonstrated. Other:  None. Musculoskeletal:  No suspicious bone lesions identified. MRI PELVIS FINDINGS Urinary Tract: The urinary bladder appears normal. Bowel: Wall thickening and inflammation involving the sigmoid colon is again noted, image 27 of series 13. Fluid collection within the pelvis ventral to the rectum measures 4.2 cm, image 32 of series 13. This is compared with 3.4 cm previously. Vascular/Lymphatic: The iliac vasculature is unremarkable. No adenopathy. Reproductive: Enlarged fibroid uterus is identified. This measures 12.1 x 8.1 x 17.3 cm. This has a volume of 847 cc. There are multiple fibroids in varying locations including subserosal, intramural and submucosal. There is fairly uniform enhancement of these fibroids following the IV administration of contrast. The largest fibroid is subserosal arising from the anterior lower uterine segment and measures 5.6 cm, image 14 of series 14. Arising from the left side of uterus is 86.1 cm fibroid, image 19 of series 11. This is also partially sub serosal. The endometrium is difficult to visualize due to distortion by fibroids. The cervix appears dilated and fluid-filled, image 17 of series 11. Cystic structure within the right adnexa measures 2.4 cm, image 14 of series 11. This is favored to represent an ovarian cyst. Several small follicles are noted within the left ovary, image 24 series 11. Other: Trace fluid identified within the pelvis. Musculoskeletal: Normal signal identified from within the bone marrow. IMPRESSION: 1. Multifocal arterial phase enhancing lesions are identified within both lobes of liver. In a patient that is at low risk for malignancy these are favored to represent either  multiple [place] is versus liver adenomas. Hypervascular liver metastases or primary liver malignancy is considered less favored. Differentiation of FNH from adenoma is recommended for lesions greater than 4 cm. In a patient that is at low risk for either primary liver malignancy or metastatic disease 3 to six-month followup imaging with MRI of the liver performed with Gretta Cool is advised. 2. Enlarged uterus containing multiple enhancing masses. Statistically these likely represent multiple benign uterine fibroids. 3. Imaging findings compatible with sigmoid colitis. 4. Persistent pelvic fluid collection, likely abscess secondary to colitis. When compared with recent CT of the pelvis this fluid collection appears slightly increased in size in the interval. Electronically Signed   By: Kerby Moors M.D.   On: 05/16/2015 12:01  Ct Abdomen Pelvis W Contrast  05/14/2015  CLINICAL DATA:  45 year old female with abdominal pain standard in the left lower quadrant. Abdominal cramping and leukocytosis. EXAM: CT ABDOMEN AND PELVIS WITH CONTRAST TECHNIQUE: Multidetector CT imaging of the abdomen and pelvis was performed using the standard protocol following bolus administration of intravenous contrast. CONTRAST:  12mL OMNIPAQUE IOHEXOL 300 MG/ML SOLN, 132mL ISOVUE-300 IOPAMIDOL (ISOVUE-300) INJECTION 61% COMPARISON:  None FINDINGS: The visualized lung bases are clear. No intra-abdominal free air. Trace free fluid within the pelvis. There multiple enhancing liver lesions. The largest lesion is located in the right lobe of the liver and measures approximately 5.0 x 4.0 cm and appears to have a central scar. Differentials include hemangioma, adenoma, focal nodular hyperplasia. Hepatocellular carcinoma or metastatic disease are not excluded. MRI without and with contrast is recommended for further characterization. There is a 2.3 x 1.4 cm hypodense lesion in the left lobe of the liver (segment IV B), possibly a cyst. Stop the  gallbladder, pancreas, spleen, adrenal glands all, kidneys, visualized ureters, and urinary bladder appear unremarkable. The uterus is enlarged and myomatous. There is a 2.5 x 1.5 cm cystic structure in the region of the right ovary, likely a dominant follicle/cyst. There is a 2.5 x 4.6 cm loculated fluid collection in the cul-de-sac (pouch of [place]) most compatible with an abscess. There is apparent segmental thickening of the rectosigmoid wall with haziness and enhancement of the mucosa concerning for colitis. Loose stool noted within the colon compatible with diarrheal state. There is no evidence of bowel obstruction. Normal appendix. The abdominal aorta and IVC appear unremarkable. No portal venous gas identified. There is no adenopathy. The abdominal wall soft tissues appear unremarkable. The osseous structures are intact. IMPRESSION: Diarrheal state with findings most compatible with colitis of the rectosigmoid. A small loculated fluid collection/abscess noted in the retrouterine space. Multiple enhancing hepatic lesions as described. Nonemergent MRI without and with contrast is recommended for further characterization. Enlarged and myomatous uterus. Right ovarian probable dominant follicle/cyst. Electronically Signed   By: Anner Crete M.D.   On: 05/14/2015 03:29    CBC  Recent Labs Lab 05/14/15 0035 05/15/15 0406 05/16/15 0438  WBC 29.3* 20.6* 14.7*  HGB 11.4* 9.6* 10.0*  HCT 34.2* 29.2* 30.6*  PLT 484* 397 448*  MCV 86.1 87.2 86.9  MCH 28.7 28.7 28.4  MCHC 33.3 32.9 32.7  RDW 13.5 13.9 14.0  LYMPHSABS 1.2 1.7 1.9  MONOABS 0.9 1.2* 0.9  EOSABS 0.0 0.1 0.1  BASOSABS 0.0 0.0 0.0    Chemistries   Recent Labs Lab 05/14/15 0035 05/15/15 0406 05/16/15 0438  NA 137 140 139  K 3.3* 3.8 3.5  CL 102 109 108  CO2 26 24 23   GLUCOSE 138* 100* 92  BUN 7 6 <5*  CREATININE 0.69 0.53 0.57  CALCIUM 8.7* 7.7* 7.9*  MG  --  2.0  --   AST 16 12* 11*  ALT 10* 8* 8*  ALKPHOS 69 54 56   BILITOT 1.1 0.3 0.5   ------------------------------------------------------------------------------------------------------------------ estimated creatinine clearance is 70.6 mL/min (by C-G formula based on Cr of 0.57). ------------------------------------------------------------------------------------------------------------------ No results for input(s): HGBA1C in the last 72 hours. ------------------------------------------------------------------------------------------------------------------ No results for input(s): CHOL, HDL, LDLCALC, TRIG, CHOLHDL, LDLDIRECT in the last 72 hours. ------------------------------------------------------------------------------------------------------------------ No results for input(s): TSH, T4TOTAL, T3FREE, THYROIDAB in the last 72 hours.  Invalid input(s): FREET3 ------------------------------------------------------------------------------------------------------------------ No results for input(s): VITAMINB12, FOLATE, FERRITIN, TIBC, IRON, RETICCTPCT in the last 72 hours.  Coagulation profile No results  for input(s): INR, PROTIME in the last 168 hours.  No results for input(s): DDIMER in the last 72 hours.  Cardiac Enzymes No results for input(s): CKMB, TROPONINI, MYOGLOBIN in the last 168 hours.  Invalid input(s): CK ------------------------------------------------------------------------------------------------------------------ Invalid input(s): POCBNP    Marit Goodwill D.O. on 05/16/2015 at 1:03 PM  Between 7am to 7pm - Pager - [phone]  After 7pm go to [email or web address] - password [email or web address]  And look for the night coverage person covering for me after hours  [facility] Office  [phone]

## 2015-05-16 NOTE — Procedures (Signed)
L transgluteal abscess drain 10 Fr No comp Pus

## 2015-05-17 LAB — CBC WITH DIFFERENTIAL/PLATELET
BASOS ABS: 0 10*3/uL (ref 0.0–0.1)
BASOS PCT: 0 %
EOS ABS: 0.2 10*3/uL (ref 0.0–0.7)
EOS PCT: 1 %
HEMATOCRIT: 30.3 % — AB (ref 36.0–46.0)
Hemoglobin: 10 g/dL — ABNORMAL LOW (ref 12.0–15.0)
Lymphocytes Relative: 18 %
Lymphs Abs: 2 10*3/uL (ref 0.7–4.0)
MCH: 28.7 pg (ref 26.0–34.0)
MCHC: 33 g/dL (ref 30.0–36.0)
MCV: 86.8 fL (ref 78.0–100.0)
MONO ABS: 0.7 10*3/uL (ref 0.1–1.0)
MONOS PCT: 6 %
NEUTROS ABS: 8.4 10*3/uL — AB (ref 1.7–7.7)
Neutrophils Relative %: 75 %
PLATELETS: 490 10*3/uL — AB (ref 150–400)
RBC: 3.49 MIL/uL — ABNORMAL LOW (ref 3.87–5.11)
RDW: 14 % (ref 11.5–15.5)
WBC: 11.2 10*3/uL — ABNORMAL HIGH (ref 4.0–10.5)

## 2015-05-17 LAB — COMPREHENSIVE METABOLIC PANEL
ALBUMIN: 2.9 g/dL — AB (ref 3.5–5.0)
ALT: 8 U/L — ABNORMAL LOW (ref 14–54)
ANION GAP: 9 (ref 5–15)
AST: 11 U/L — AB (ref 15–41)
Alkaline Phosphatase: 54 U/L (ref 38–126)
CHLORIDE: 108 mmol/L (ref 101–111)
CO2: 25 mmol/L (ref 22–32)
Calcium: 8.2 mg/dL — ABNORMAL LOW (ref 8.9–10.3)
Creatinine, Ser: 0.57 mg/dL (ref 0.44–1.00)
GFR calc Af Amer: >60 mL/min (ref >60)
GFR calc non Af Amer: >60 mL/min (ref >60)
GLUCOSE: 94 mg/dL (ref 65–99)
POTASSIUM: 3.4 mmol/L — AB (ref 3.5–5.1)
SODIUM: 142 mmol/L (ref 135–145)
TOTAL PROTEIN: 6.3 g/dL — AB (ref 6.5–8.1)
Total Bilirubin: 0.5 mg/dL (ref 0.3–1.2)

## 2015-05-17 MED ORDER — PANTOPRAZOLE SODIUM 40 MG PO TBEC
40.0000 mg | DELAYED_RELEASE_TABLET | Freq: Every day | ORAL | Status: DC
  Administered 2015-05-18: 40 mg via ORAL
  Filled 2015-05-17: qty 1

## 2015-05-17 MED ORDER — POTASSIUM CHLORIDE CRYS ER 20 MEQ PO TBCR
40.0000 meq | EXTENDED_RELEASE_TABLET | Freq: Once | ORAL | Status: AC
  Administered 2015-05-17: 40 meq via ORAL
  Filled 2015-05-17: qty 2

## 2015-05-17 NOTE — Progress Notes (Signed)
PHARMACIST - PHYSICIAN COMMUNICATION  CONCERNING: IV to Oral Route Change Policy  RECOMMENDATION: This patient is receiving pantoprazole by the intravenous route.  Based on criteria approved by the Pharmacy and Therapeutics Committee, the intravenous medication(s) is/are being converted to the equivalent oral dose form(s).   DESCRIPTION: These criteria include:  The patient is eating (either orally or via tube) and/or has been taking other orally administered medications for a least 24 hours  The patient has no evidence of active gastrointestinal bleeding or impaired GI absorption (gastrectomy, short bowel, patient on TNA or NPO).  If you have questions about this conversion, please contact the Pharmacy Department  []   [phone] )  [facility] []   [phone] )  [facility] []   [phone] )  [facility] []   [phone] )  [facility] [x]   [phone] )  [facility], PharmD candidate 05/17/2015 7:58 AM

## 2015-05-17 NOTE — Progress Notes (Signed)
Patient ID: Cheryl Grant, female   DOB: 07/19/1970, 45 y.o.   MRN: [id number]    Referring Physician(s): Cristal Ford  Supervising Physician: Markus Daft  Chief Complaint: Pelvic fluid collection   Subjective:  Pt feeling better today; does have some soreness at left TG drain site as expected and few loose stools  Allergies: Ibuprofen  Medications: Prior to Admission medications   Not on File     Vital Signs: BP 107/79 mmHg  Pulse 87  Temp(Src) 98.9 F (37.2 C) (Oral)  Resp 18  Ht 5' (1.524 m)  Wt 124 lb (56.246 kg)  BMI 24.22 kg/m2  SpO2 100%  LMP 05/04/2015  Physical Exam awake/alert; left TG drain intact, output 20 cc yellow fluid; cx's pend; drain irrigated with sterile NS with little return  Imaging: Mr Pelvis W Wo Contrast  05/16/2015  CLINICAL DATA:  Evaluate previously noted liver lesions. Uterine fibroids. Evaluate for sarcoma. EXAM: MRI ABDOMEN AND PELVIS WITHOUT CONTRAST TECHNIQUE: Multiplanar multisequence MR imaging of the abdomen and pelvis was performed. No intravenous contrast was administered. COMPARISON:  05/14/2015 FINDINGS: MRI ABDOMEN FINDINGS Lower chest: Small pleural effusions are noted right greater than left. Hepatobiliary: There are multiple arterial phase enhancing liver lesions identified within both lobes. The largest is in the posterior right lobe of liver measuring 4.3 cm, image 18 of series 1901. This demonstrates avid arterial enhancement. Multiple arterial phase enhancing lesions are also noted within the left lobe. The largest measures 3.2 cm, image 31 of series 1901. Nonenhancing T2 hyperintense structure within the medial segment of left lobe of liver is favored to represent a simple cyst. This measures 1.9 cm, image 25 of series 3. The gallbladder appears normal. No biliary dilatation. Pancreas:  The pancreas appears normal. Spleen:  Within normal limits in size and appearance. Adrenals/Urinary Tract: No masses identified. No evidence  of hydronephrosis. Stomach/Bowel: Visualized portions within the abdomen are unremarkable. Vascular/Lymphatic: No pathologically enlarged lymph nodes identified. No abdominal aortic aneurysm demonstrated. Other:  None. Musculoskeletal:  No suspicious bone lesions identified. MRI PELVIS FINDINGS Urinary Tract: The urinary bladder appears normal. Bowel: Wall thickening and inflammation involving the sigmoid colon is again noted, image 27 of series 13. Fluid collection within the pelvis ventral to the rectum measures 4.2 cm, image 32 of series 13. This is compared with 3.4 cm previously. Vascular/Lymphatic: The iliac vasculature is unremarkable. No adenopathy. Reproductive: Enlarged fibroid uterus is identified. This measures 12.1 x 8.1 x 17.3 cm. This has a volume of 847 cc. There are multiple fibroids in varying locations including subserosal, intramural and submucosal. There is fairly uniform enhancement of these fibroids following the IV administration of contrast. The largest fibroid is subserosal arising from the anterior lower uterine segment and measures 5.6 cm, image 14 of series 14. Arising from the left side of uterus is 86.1 cm fibroid, image 19 of series 11. This is also partially sub serosal. The endometrium is difficult to visualize due to distortion by fibroids. The cervix appears dilated and fluid-filled, image 17 of series 11. Cystic structure within the right adnexa measures 2.4 cm, image 14 of series 11. This is favored to represent an ovarian cyst. Several small follicles are noted within the left ovary, image 24 series 11. Other: Trace fluid identified within the pelvis. Musculoskeletal: Normal signal identified from within the bone marrow. IMPRESSION: 1. Multifocal arterial phase enhancing lesions are identified within both lobes of liver. In a patient that is at low risk for malignancy these are favored to  represent either multiple [place] is versus liver adenomas. Hypervascular liver metastases or  primary liver malignancy is considered less favored. Differentiation of FNH from adenoma is recommended for lesions greater than 4 cm. In a patient that is at low risk for either primary liver malignancy or metastatic disease 3 to six-month followup imaging with MRI of the liver performed with Gretta Cool is advised. 2. Enlarged uterus containing multiple enhancing masses. Statistically these likely represent multiple benign uterine fibroids. 3. Imaging findings compatible with sigmoid colitis. 4. Persistent pelvic fluid collection, likely abscess secondary to colitis. When compared with recent CT of the pelvis this fluid collection appears slightly increased in size in the interval. Electronically Signed   By: Kerby Moors M.D.   On: 05/16/2015 12:01   Mr Abdomen W Wo Contrast  05/16/2015  CLINICAL DATA:  Evaluate previously noted liver lesions. Uterine fibroids. Evaluate for sarcoma. EXAM: MRI ABDOMEN AND PELVIS WITHOUT CONTRAST TECHNIQUE: Multiplanar multisequence MR imaging of the abdomen and pelvis was performed. No intravenous contrast was administered. COMPARISON:  05/14/2015 FINDINGS: MRI ABDOMEN FINDINGS Lower chest: Small pleural effusions are noted right greater than left. Hepatobiliary: There are multiple arterial phase enhancing liver lesions identified within both lobes. The largest is in the posterior right lobe of liver measuring 4.3 cm, image 18 of series 1901. This demonstrates avid arterial enhancement. Multiple arterial phase enhancing lesions are also noted within the left lobe. The largest measures 3.2 cm, image 31 of series 1901. Nonenhancing T2 hyperintense structure within the medial segment of left lobe of liver is favored to represent a simple cyst. This measures 1.9 cm, image 25 of series 3. The gallbladder appears normal. No biliary dilatation. Pancreas:  The pancreas appears normal. Spleen:  Within normal limits in size and appearance. Adrenals/Urinary Tract: No masses identified. No  evidence of hydronephrosis. Stomach/Bowel: Visualized portions within the abdomen are unremarkable. Vascular/Lymphatic: No pathologically enlarged lymph nodes identified. No abdominal aortic aneurysm demonstrated. Other:  None. Musculoskeletal:  No suspicious bone lesions identified. MRI PELVIS FINDINGS Urinary Tract: The urinary bladder appears normal. Bowel: Wall thickening and inflammation involving the sigmoid colon is again noted, image 27 of series 13. Fluid collection within the pelvis ventral to the rectum measures 4.2 cm, image 32 of series 13. This is compared with 3.4 cm previously. Vascular/Lymphatic: The iliac vasculature is unremarkable. No adenopathy. Reproductive: Enlarged fibroid uterus is identified. This measures 12.1 x 8.1 x 17.3 cm. This has a volume of 847 cc. There are multiple fibroids in varying locations including subserosal, intramural and submucosal. There is fairly uniform enhancement of these fibroids following the IV administration of contrast. The largest fibroid is subserosal arising from the anterior lower uterine segment and measures 5.6 cm, image 14 of series 14. Arising from the left side of uterus is 86.1 cm fibroid, image 19 of series 11. This is also partially sub serosal. The endometrium is difficult to visualize due to distortion by fibroids. The cervix appears dilated and fluid-filled, image 17 of series 11. Cystic structure within the right adnexa measures 2.4 cm, image 14 of series 11. This is favored to represent an ovarian cyst. Several small follicles are noted within the left ovary, image 24 series 11. Other: Trace fluid identified within the pelvis. Musculoskeletal: Normal signal identified from within the bone marrow. IMPRESSION: 1. Multifocal arterial phase enhancing lesions are identified within both lobes of liver. In a patient that is at low risk for malignancy these are favored to represent either multiple [place] is versus liver  adenomas. Hypervascular liver  metastases or primary liver malignancy is considered less favored. Differentiation of FNH from adenoma is recommended for lesions greater than 4 cm. In a patient that is at low risk for either primary liver malignancy or metastatic disease 3 to six-month followup imaging with MRI of the liver performed with Gretta Cool is advised. 2. Enlarged uterus containing multiple enhancing masses. Statistically these likely represent multiple benign uterine fibroids. 3. Imaging findings compatible with sigmoid colitis. 4. Persistent pelvic fluid collection, likely abscess secondary to colitis. When compared with recent CT of the pelvis this fluid collection appears slightly increased in size in the interval. Electronically Signed   By: Kerby Moors M.D.   On: 05/16/2015 12:01   Ct Abdomen Pelvis W Contrast  05/14/2015  CLINICAL DATA:  45 year old female with abdominal pain standard in the left lower quadrant. Abdominal cramping and leukocytosis. EXAM: CT ABDOMEN AND PELVIS WITH CONTRAST TECHNIQUE: Multidetector CT imaging of the abdomen and pelvis was performed using the standard protocol following bolus administration of intravenous contrast. CONTRAST:  55mL OMNIPAQUE IOHEXOL 300 MG/ML SOLN, 131mL ISOVUE-300 IOPAMIDOL (ISOVUE-300) INJECTION 61% COMPARISON:  None FINDINGS: The visualized lung bases are clear. No intra-abdominal free air. Trace free fluid within the pelvis. There multiple enhancing liver lesions. The largest lesion is located in the right lobe of the liver and measures approximately 5.0 x 4.0 cm and appears to have a central scar. Differentials include hemangioma, adenoma, focal nodular hyperplasia. Hepatocellular carcinoma or metastatic disease are not excluded. MRI without and with contrast is recommended for further characterization. There is a 2.3 x 1.4 cm hypodense lesion in the left lobe of the liver (segment IV B), possibly a cyst. Stop the gallbladder, pancreas, spleen, adrenal glands all, kidneys,  visualized ureters, and urinary bladder appear unremarkable. The uterus is enlarged and myomatous. There is a 2.5 x 1.5 cm cystic structure in the region of the right ovary, likely a dominant follicle/cyst. There is a 2.5 x 4.6 cm loculated fluid collection in the cul-de-sac (pouch of [place]) most compatible with an abscess. There is apparent segmental thickening of the rectosigmoid wall with haziness and enhancement of the mucosa concerning for colitis. Loose stool noted within the colon compatible with diarrheal state. There is no evidence of bowel obstruction. Normal appendix. The abdominal aorta and IVC appear unremarkable. No portal venous gas identified. There is no adenopathy. The abdominal wall soft tissues appear unremarkable. The osseous structures are intact. IMPRESSION: Diarrheal state with findings most compatible with colitis of the rectosigmoid. A small loculated fluid collection/abscess noted in the retrouterine space. Multiple enhancing hepatic lesions as described. Nonemergent MRI without and with contrast is recommended for further characterization. Enlarged and myomatous uterus. Right ovarian probable dominant follicle/cyst. Electronically Signed   By: Anner Crete M.D.   On: 05/14/2015 03:29   Ct Image Guided Drainage By Percutaneous Catheter  05/17/2015  INDICATION: Pelvic abscess EXAM: CT IMAGE GUIDED DRAINAGE BY PERCUTANEOUS CATHETER MEDICATIONS: The patient is currently admitted to the hospital and receiving intravenous antibiotics. The antibiotics were administered within an appropriate time frame prior to the initiation of the procedure. ANESTHESIA/SEDATION: Fentanyl 5 mcg IV; Versed 100 mg IV Moderate Sedation Time:  21 The patient was continuously monitored during the procedure by the interventional radiology nurse under my direct supervision. COMPLICATIONS: None immediate. PROCEDURE: Informed written consent was obtained from the patient after a thorough discussion of the  procedural risks, benefits and alternatives. All questions were addressed. Maximal Sterile Barrier Technique was utilized including  caps, mask, sterile gowns, sterile gloves, sterile drape, hand hygiene and skin antiseptic. A timeout was performed prior to the initiation of the procedure. The left gluteal region was prepped and draped in a sterile fashion. Under CT guidance, an 18 gauge needle was inserted into the pelvic abscess via left trans gluteal approach. Ten [place] dilator followed by a 10 [place] drain were inserted. It was looped and string fixed in the fluid collection then sewn to the skin. Frank pus was aspirated FINDINGS: Imaging confirms placement of a left 10 [place] trans gluteal pelvic abscess. IMPRESSION: Successful trans gluteal 10 French left pelvic abscess drain Electronically Signed   By: Marybelle Killings M.D.   On: 05/17/2015 11:11    Labs:  CBC:  Recent Labs  05/14/15 0035 05/15/15 0406 05/16/15 0438 05/17/15 0439  WBC 29.3* 20.6* 14.7* 11.2*  HGB 11.4* 9.6* 10.0* 10.0*  HCT 34.2* 29.2* 30.6* 30.3*  PLT 484* 397 448* 490*    COAGS:  Recent Labs  05/16/15 1454  INR 1.25    BMP:  Recent Labs  05/14/15 0035 05/15/15 0406 05/16/15 0438 05/17/15 0439  NA 137 140 139 142  K 3.3* 3.8 3.5 3.4*  CL 102 109 108 108  CO2 26 24 23 25   GLUCOSE 138* 100* 92 94  BUN 7 6 <5* <5*  CALCIUM 8.7* 7.7* 7.9* 8.2*  CREATININE 0.69 0.53 0.57 0.57  GFRNONAA >60 >60 >60 >60  GFRAA >60 >60 >60 >60    LIVER FUNCTION TESTS:  Recent Labs  05/14/15 0035 05/15/15 0406 05/16/15 0438 05/17/15 0439  BILITOT 1.1 0.3 0.5 0.5  AST 16 12* 11* 11*  ALT 10* 8* 8* 8*  ALKPHOS 69 54 56 54  PROT 7.7 6.2* 6.3* 6.3*  ALBUMIN 3.4* 2.6* 2.8* 2.9*    Assessment and Plan: S/p drainage of left pelvic fluid collection 3/27; AF; WBC 11.2(14.7), hgb stable; creat ok; check final fluid cx's; cont drain irrigation; send drain fluid for creatinine; if pt d/c'd home with drain can f/u in  drain clinic ([phone])next week- rec once daily irrigation of drain, recording of output and dressing changes at least every other day as OP.   Electronically Signed: D. Rowe Robert 05/17/2015, 2:34 PM   I spent a total of 15 minutes at the the patient's bedside AND on the patient's hospital floor or unit, greater than 50% of which was counseling/coordinating care for left pelvic drain

## 2015-05-17 NOTE — Progress Notes (Signed)
[facility]                                                                              Patient Demographics  Cheryl Grant, is a 45 y.o. female, DOB - 19-Sep-1970, [id number]  Admit date - 05/13/2015   Admitting Physician Reubin Milan, MD  Outpatient Primary MD for the patient is No primary care provider on file.  LOS - 3   No chief complaint on file.     HPI on 05/14/2015 by Dr. Gerri Lins Cheryl Grant is a 45 y.o. female with no past medical history who comes to emergency department due to abdominal pain since early morning yesterday morning associated with constipation.  Per patient, she woke up yesterday around 6 in the morning with intense left lower quadrant pain. She states that she felt like she had an obstruction, because she was unable to have a bowel movement. She said that she took 3 different types of medications for constipation and had a bowel movement around 3 PM showing very hard stools. The patient stated that she felt like she was passing a rock. Associated symptoms have been abdominal distention, 3 episodes of nausea with emesis and fever.  When seen in the emergency department, the patient stated that she felt a lot better after she was given analgesics. Workup shows leukocytosis of 20 9.3K and a CT scan of the abdomen showing findings consistent with colitis and the retro-uterine abscess or fluid collection.  Assessment & Plan  Sepsis secondary to colitis/ Abdominal pain secondary to colitis/obstipation -Upon admission, patient was tachycardic with leukocytosis and elevated lactic acid -Leukocytosis and lactic acid improving -CT of the abdomen pelvis: Diarrheal state findings most compatible with colitis of the rectosigmoid. A small loculated fluid collection/abscess noted in the retrouterine space -Continue ceftriaxone and Flagyl -Gen. surgery consulted recommended IR for possible drain placement. -Spoke with IR 05/14/15, felt that  abscess/fluid collection was small for drain placement. Perhaps will resolve within the next couple of days. Repeat CT scan in several days. -Spoke with Dr. Clovis Riley, felt abscess/fluid collection is increasing in size on MRI -IR placed drain 05/16/15 -Advance diet today -Patient will need colonoscopy after colitis resolves- 6-8 weeks  Liver mass -Noted on CT scan. Multiple enhancing hepatic lesions. Nonemergent MRI recommended. -MRI abd/pelvis: possible focal nodular hyperplasia vs adenoma.  Recommended 3-6 month MRI with E[facility]ist for follow up  Uterine fibroid -Patient will need gynecologic follow-up. Per patient has been long standing problem. -Bleeding, however, hemoglobin stable -Patient states she was having regular periods until 3-4 months ago, then started having periods twice/month -Spoke with Dr. Nehemiah Settle (Gyn) recommended outpatient follow and starting megace 80mg  BID until seen by gynecology  Hypokalemia -Replace and continue to monitor BMP  Chronic constipation -Patient was taking bowel regimen at home -Able to have several BMs here  Hypoalbuminemia -Albumin 2.6 today  -Nutrition consulted  Code Status: Full  Family Communication: None at bedside  Disposition Plan: Admitted. Likely discharge within 24 hours.  Time Spent in minutes 30 minutes  Procedures  CT guided drainage of pelvic fluid collection   Consults  General surgery Interventional radiology  Gynecology, via phone  DVT Prophylaxis  SCDs  Lab Results  Component Value Date   PLT 490* 05/17/2015    Medications  Scheduled Meds: . bisacodyl  10 mg Rectal Daily  . cefTRIAXone (ROCEPHIN)  IV  2 g Intravenous Q24H  . fluconazole  200 mg Oral Daily  . lip balm  1 application Topical BID  . megestrol  80 mg Oral BID  . metronidazole  500 mg Intravenous Q6H  . [START ON 05/18/2015] pantoprazole  40 mg Oral Daily   Continuous Infusions: . 0.9 % NaCl with KCl 20 mEq / L 50 mL/hr at 05/17/15 0049     PRN Meds:.acetaminophen, acetaminophen, alum & mag hydroxide-simeth, diphenhydrAMINE, HYDROmorphone (DILAUDID) injection, HYDROmorphone (DILAUDID) injection, magic mouthwash, menthol-cetylpyridinium, methocarbamol (ROBAXIN)  IV, metoprolol, ondansetron (ZOFRAN) IV, ondansetron, phenol, promethazine  Antibiotics    Anti-infectives    Start     Dose/Rate Route Frequency Ordered Stop   05/15/15 1030  fluconazole (DIFLUCAN) tablet 200 mg     200 mg Oral Daily 05/15/15 1025     05/14/15 1200  piperacillin-tazobactam (ZOSYN) IVPB 3.375 g  Status:  Discontinued     3.375 g 12.5 mL/hr over 240 Minutes Intravenous Every 8 hours 05/14/15 0748 05/15/15 1458   05/14/15 0745  piperacillin-tazobactam (ZOSYN) IVPB 3.375 g  Status:  Discontinued     3.375 g 100 mL/hr over 30 Minutes Intravenous 3 times per day 05/14/15 0744 05/14/15 0747   05/14/15 0715  cefTRIAXone (ROCEPHIN) 2 g in dextrose 5 % 50 mL IVPB    Comments:  Pharmacy may adjust dosing strength / duration / interval for maximal efficacy   2 g 100 mL/hr over 30 Minutes Intravenous Every 24 hours 05/14/15 0702     05/14/15 0715  metroNIDAZOLE (FLAGYL) IVPB 500 mg     500 mg 100 mL/hr over 60 Minutes Intravenous Every 6 hours 05/14/15 0702     05/14/15 0345  piperacillin-tazobactam (ZOSYN) IVPB 3.375 g     3.375 g 100 mL/hr over 30 Minutes Intravenous  Once 05/14/15 [id number] 05/14/15 0546        Subjective:   Cheryl Grant seen and examined today.  Patient states her bleeding is slowing down.  Denies chest pain, shortness of breath.  Has a little abdominal tenderness this morning..  Objective:   Filed Vitals:   05/16/15 1649 05/16/15 1654 05/16/15 1659 05/16/15 2140  BP: 112/59 108/61 111/64 107/79  Pulse: 83 84 84 87  Temp:    98.9 F (37.2 C)  TempSrc:    Oral  Resp: 17 16 18 18   Height:      Weight:      SpO2: 100% 100% 99% 100%    Wt Readings from Last 3 Encounters:  05/16/15 56.246 kg (124 lb)     Intake/Output  Summary (Last 24 hours) at 05/17/15 1238 Last data filed at 05/17/15 1056  Gross per 24 hour  Intake   2460 ml  Output    720 ml  Net   1740 ml    Exam  General: Well developed, well nourished, NAD  HEENT: NCAT,  mucous membranes moist.   Cardiovascular: S1 S2 auscultated, no murmurs, RRR.  Respiratory: Clear to auscultation bilaterally   Abdomen: Soft, Mild TTP RLQ, nondistended, + bowel sounds, enlarged uterus. Drain in place with yellow fluid  Extremities: warm dry without cyanosis clubbing or edema  Neuro: AAOx3, nonfocal  Psych: Normal affect and demeanor, pleasant  Data Review   Micro Results No results found for this or any previous visit (  from the past 240 hour(s)).  Radiology Reports Mr Pelvis W Wo Contrast  05/16/2015  CLINICAL DATA:  Evaluate previously noted liver lesions. Uterine fibroids. Evaluate for sarcoma. EXAM: MRI ABDOMEN AND PELVIS WITHOUT CONTRAST TECHNIQUE: Multiplanar multisequence MR imaging of the abdomen and pelvis was performed. No intravenous contrast was administered. COMPARISON:  05/14/2015 FINDINGS: MRI ABDOMEN FINDINGS Lower chest: Small pleural effusions are noted right greater than left. Hepatobiliary: There are multiple arterial phase enhancing liver lesions identified within both lobes. The largest is in the posterior right lobe of liver measuring 4.3 cm, image 18 of series 1901. This demonstrates avid arterial enhancement. Multiple arterial phase enhancing lesions are also noted within the left lobe. The largest measures 3.2 cm, image 31 of series 1901. Nonenhancing T2 hyperintense structure within the medial segment of left lobe of liver is favored to represent a simple cyst. This measures 1.9 cm, image 25 of series 3. The gallbladder appears normal. No biliary dilatation. Pancreas:  The pancreas appears normal. Spleen:  Within normal limits in size and appearance. Adrenals/Urinary Tract: No masses identified. No evidence of hydronephrosis.  Stomach/Bowel: Visualized portions within the abdomen are unremarkable. Vascular/Lymphatic: No pathologically enlarged lymph nodes identified. No abdominal aortic aneurysm demonstrated. Other:  None. Musculoskeletal:  No suspicious bone lesions identified. MRI PELVIS FINDINGS Urinary Tract: The urinary bladder appears normal. Bowel: Wall thickening and inflammation involving the sigmoid colon is again noted, image 27 of series 13. Fluid collection within the pelvis ventral to the rectum measures 4.2 cm, image 32 of series 13. This is compared with 3.4 cm previously. Vascular/Lymphatic: The iliac vasculature is unremarkable. No adenopathy. Reproductive: Enlarged fibroid uterus is identified. This measures 12.1 x 8.1 x 17.3 cm. This has a volume of 847 cc. There are multiple fibroids in varying locations including subserosal, intramural and submucosal. There is fairly uniform enhancement of these fibroids following the IV administration of contrast. The largest fibroid is subserosal arising from the anterior lower uterine segment and measures 5.6 cm, image 14 of series 14. Arising from the left side of uterus is 86.1 cm fibroid, image 19 of series 11. This is also partially sub serosal. The endometrium is difficult to visualize due to distortion by fibroids. The cervix appears dilated and fluid-filled, image 17 of series 11. Cystic structure within the right adnexa measures 2.4 cm, image 14 of series 11. This is favored to represent an ovarian cyst. Several small follicles are noted within the left ovary, image 24 series 11. Other: Trace fluid identified within the pelvis. Musculoskeletal: Normal signal identified from within the bone marrow. IMPRESSION: 1. Multifocal arterial phase enhancing lesions are identified within both lobes of liver. In a patient that is at low risk for malignancy these are favored to represent either multiple Landis is versus liver adenomas. Hypervascular liver metastases or primary liver  malignancy is considered less favored. Differentiation of FNH from adenoma is recommended for lesions greater than 4 cm. In a patient that is at low risk for either primary liver malignancy or metastatic disease 3 to six-month followup imaging with MRI of the liver performed with Gretta Cool is advised. 2. Enlarged uterus containing multiple enhancing masses. Statistically these likely represent multiple benign uterine fibroids. 3. Imaging findings compatible with sigmoid colitis. 4. Persistent pelvic fluid collection, likely abscess secondary to colitis. When compared with recent CT of the pelvis this fluid collection appears slightly increased in size in the interval. Electronically Signed   By: Kerby Moors M.D.   On: 05/16/2015 12:01  Mr Abdomen W Wo Contrast  05/16/2015  CLINICAL DATA:  Evaluate previously noted liver lesions. Uterine fibroids. Evaluate for sarcoma. EXAM: MRI ABDOMEN AND PELVIS WITHOUT CONTRAST TECHNIQUE: Multiplanar multisequence MR imaging of the abdomen and pelvis was performed. No intravenous contrast was administered. COMPARISON:  05/14/2015 FINDINGS: MRI ABDOMEN FINDINGS Lower chest: Small pleural effusions are noted right greater than left. Hepatobiliary: There are multiple arterial phase enhancing liver lesions identified within both lobes. The largest is in the posterior right lobe of liver measuring 4.3 cm, image 18 of series 1901. This demonstrates avid arterial enhancement. Multiple arterial phase enhancing lesions are also noted within the left lobe. The largest measures 3.2 cm, image 31 of series 1901. Nonenhancing T2 hyperintense structure within the medial segment of left lobe of liver is favored to represent a simple cyst. This measures 1.9 cm, image 25 of series 3. The gallbladder appears normal. No biliary dilatation. Pancreas:  The pancreas appears normal. Spleen:  Within normal limits in size and appearance. Adrenals/Urinary Tract: No masses identified. No evidence of  hydronephrosis. Stomach/Bowel: Visualized portions within the abdomen are unremarkable. Vascular/Lymphatic: No pathologically enlarged lymph nodes identified. No abdominal aortic aneurysm demonstrated. Other:  None. Musculoskeletal:  No suspicious bone lesions identified. MRI PELVIS FINDINGS Urinary Tract: The urinary bladder appears normal. Bowel: Wall thickening and inflammation involving the sigmoid colon is again noted, image 27 of series 13. Fluid collection within the pelvis ventral to the rectum measures 4.2 cm, image 32 of series 13. This is compared with 3.4 cm previously. Vascular/Lymphatic: The iliac vasculature is unremarkable. No adenopathy. Reproductive: Enlarged fibroid uterus is identified. This measures 12.1 x 8.1 x 17.3 cm. This has a volume of 847 cc. There are multiple fibroids in varying locations including subserosal, intramural and submucosal. There is fairly uniform enhancement of these fibroids following the IV administration of contrast. The largest fibroid is subserosal arising from the anterior lower uterine segment and measures 5.6 cm, image 14 of series 14. Arising from the left side of uterus is 86.1 cm fibroid, image 19 of series 11. This is also partially sub serosal. The endometrium is difficult to visualize due to distortion by fibroids. The cervix appears dilated and fluid-filled, image 17 of series 11. Cystic structure within the right adnexa measures 2.4 cm, image 14 of series 11. This is favored to represent an ovarian cyst. Several small follicles are noted within the left ovary, image 24 series 11. Other: Trace fluid identified within the pelvis. Musculoskeletal: Normal signal identified from within the bone marrow. IMPRESSION: 1. Multifocal arterial phase enhancing lesions are identified within both lobes of liver. In a patient that is at low risk for malignancy these are favored to represent either multiple Wright is versus liver adenomas. Hypervascular liver metastases or  primary liver malignancy is considered less favored. Differentiation of FNH from adenoma is recommended for lesions greater than 4 cm. In a patient that is at low risk for either primary liver malignancy or metastatic disease 3 to six-month followup imaging with MRI of the liver performed with Gretta Cool is advised. 2. Enlarged uterus containing multiple enhancing masses. Statistically these likely represent multiple benign uterine fibroids. 3. Imaging findings compatible with sigmoid colitis. 4. Persistent pelvic fluid collection, likely abscess secondary to colitis. When compared with recent CT of the pelvis this fluid collection appears slightly increased in size in the interval. Electronically Signed   By: Kerby Moors M.D.   On: 05/16/2015 12:01   Ct Abdomen Pelvis W Contrast  05/14/2015  CLINICAL DATA:  45 year old female with abdominal pain standard in the left lower quadrant. Abdominal cramping and leukocytosis. EXAM: CT ABDOMEN AND PELVIS WITH CONTRAST TECHNIQUE: Multidetector CT imaging of the abdomen and pelvis was performed using the standard protocol following bolus administration of intravenous contrast. CONTRAST:  34mL OMNIPAQUE IOHEXOL 300 MG/ML SOLN, 182mL ISOVUE-300 IOPAMIDOL (ISOVUE-300) INJECTION 61% COMPARISON:  None FINDINGS: The visualized lung bases are clear. No intra-abdominal free air. Trace free fluid within the pelvis. There multiple enhancing liver lesions. The largest lesion is located in the right lobe of the liver and measures approximately 5.0 x 4.0 cm and appears to have a central scar. Differentials include hemangioma, adenoma, focal nodular hyperplasia. Hepatocellular carcinoma or metastatic disease are not excluded. MRI without and with contrast is recommended for further characterization. There is a 2.3 x 1.4 cm hypodense lesion in the left lobe of the liver (segment IV B), possibly a cyst. Stop the gallbladder, pancreas, spleen, adrenal glands all, kidneys, visualized  ureters, and urinary bladder appear unremarkable. The uterus is enlarged and myomatous. There is a 2.5 x 1.5 cm cystic structure in the region of the right ovary, likely a dominant follicle/cyst. There is a 2.5 x 4.6 cm loculated fluid collection in the cul-de-sac (pouch of Douglas) most compatible with an abscess. There is apparent segmental thickening of the rectosigmoid wall with haziness and enhancement of the mucosa concerning for colitis. Loose stool noted within the colon compatible with diarrheal state. There is no evidence of bowel obstruction. Normal appendix. The abdominal aorta and IVC appear unremarkable. No portal venous gas identified. There is no adenopathy. The abdominal wall soft tissues appear unremarkable. The osseous structures are intact. IMPRESSION: Diarrheal state with findings most compatible with colitis of the rectosigmoid. A small loculated fluid collection/abscess noted in the retrouterine space. Multiple enhancing hepatic lesions as described. Nonemergent MRI without and with contrast is recommended for further characterization. Enlarged and myomatous uterus. Right ovarian probable dominant follicle/cyst. Electronically Signed   By: Anner Crete M.D.   On: 05/14/2015 03:29   Ct Image Guided Drainage By Percutaneous Catheter  05/17/2015  INDICATION: Pelvic abscess EXAM: CT IMAGE GUIDED DRAINAGE BY PERCUTANEOUS CATHETER MEDICATIONS: The patient is currently admitted to the hospital and receiving intravenous antibiotics. The antibiotics were administered within an appropriate time frame prior to the initiation of the procedure. ANESTHESIA/SEDATION: Fentanyl 5 mcg IV; Versed 100 mg IV Moderate Sedation Time:  21 The patient was continuously monitored during the procedure by the interventional radiology nurse under my direct supervision. COMPLICATIONS: None immediate. PROCEDURE: Informed written consent was obtained from the patient after a thorough discussion of the procedural risks,  benefits and alternatives. All questions were addressed. Maximal Sterile Barrier Technique was utilized including caps, mask, sterile gowns, sterile gloves, sterile drape, hand hygiene and skin antiseptic. A timeout was performed prior to the initiation of the procedure. The left gluteal region was prepped and draped in a sterile fashion. Under CT guidance, an 18 gauge needle was inserted into the pelvic abscess via left trans gluteal approach. Ten [place] dilator followed by a 10 [place] drain were inserted. It was looped and string fixed in the fluid collection then sewn to the skin. Frank pus was aspirated FINDINGS: Imaging confirms placement of a left 10 [place] trans gluteal pelvic abscess. IMPRESSION: Successful trans gluteal 10 French left pelvic abscess drain Electronically Signed   By: Marybelle Killings M.D.   On: 05/17/2015 11:11    CBC  Recent Labs Lab 05/14/15 0035 05/15/15  0406 05/16/15 0438 05/17/15 0439  WBC 29.3* 20.6* 14.7* 11.2*  HGB 11.4* 9.6* 10.0* 10.0*  HCT 34.2* 29.2* 30.6* 30.3*  PLT 484* 397 448* 490*  MCV 86.1 87.2 86.9 86.8  MCH 28.7 28.7 28.4 28.7  MCHC 33.3 32.9 32.7 33.0  RDW 13.5 13.9 14.0 14.0  LYMPHSABS 1.2 1.7 1.9 2.0  MONOABS 0.9 1.2* 0.9 0.7  EOSABS 0.0 0.1 0.1 0.2  BASOSABS 0.0 0.0 0.0 0.0    Chemistries   Recent Labs Lab 05/14/15 0035 05/15/15 0406 05/16/15 0438 05/17/15 0439  NA 137 140 139 142  K 3.3* 3.8 3.5 3.4*  CL 102 109 108 108  CO2 26 24 23 25   GLUCOSE 138* 100* 92 94  BUN 7 6 <5* <5*  CREATININE 0.69 0.53 0.57 0.57  CALCIUM 8.7* 7.7* 7.9* 8.2*  MG  --  2.0  --   --   AST 16 12* 11* 11*  ALT 10* 8* 8* 8*  ALKPHOS 69 54 56 54  BILITOT 1.1 0.3 0.5 0.5   ------------------------------------------------------------------------------------------------------------------ estimated creatinine clearance is 70.6 mL/min (by C-G formula based on Cr of  0.57). ------------------------------------------------------------------------------------------------------------------ No results for input(s): HGBA1C in the last 72 hours. ------------------------------------------------------------------------------------------------------------------ No results for input(s): CHOL, HDL, LDLCALC, TRIG, CHOLHDL, LDLDIRECT in the last 72 hours. ------------------------------------------------------------------------------------------------------------------ No results for input(s): TSH, T4TOTAL, T3FREE, THYROIDAB in the last 72 hours.  Invalid input(s): FREET3 ------------------------------------------------------------------------------------------------------------------ No results for input(s): VITAMINB12, FOLATE, FERRITIN, TIBC, IRON, RETICCTPCT in the last 72 hours.  Coagulation profile  Recent Labs Lab 05/16/15 1454  INR 1.25    No results for input(s): DDIMER in the last 72 hours.  Cardiac Enzymes No results for input(s): CKMB, TROPONINI, MYOGLOBIN in the last 168 hours.  Invalid input(s): CK ------------------------------------------------------------------------------------------------------------------ Invalid input(s): POCBNP    Vetra Shinall D.O. on 05/17/2015 at 12:38 PM  Between 7am to 7pm - Pager - [phone]  After 7pm go to [email or web address] - password [email or web address]  And look for the night coverage person covering for me after hours  [facility] Office  [phone]

## 2015-05-17 NOTE — Progress Notes (Signed)
Patient ID: Cheryl Grant, female   DOB: 1970-11-13, 45 y.o.   MRN: [id number]     [facility]      [address] [place], [place] [address]    Phone: [phone] FAX: [phone]     Subjective: No n/v, minimal pain.  WBC down to 11.2k. No fevers. Tolerating a diet.   Objective:  Vital signs:  Filed Vitals:   05/16/15 1649 05/16/15 1654 05/16/15 1659 05/16/15 2140  BP: 112/59 108/61 111/64 107/79  Pulse: 83 84 84 87  Temp:    98.9 F (37.2 C)  TempSrc:    Oral  Resp: _0 Height:      Weight:      SpO2: 100% 100% 99% 100%    Last BM Date: 05/16/15  Intake/Output   Yesterday:  03/27 0701 - 03/28 0700 In: 2260 [P.O.:960; I.V.:950; IV Piggyback:350] Out: 1020 [Urine:1000; Drains:20] This shift: I/O last 3 completed shifts: In: 5933.3 [P.O.:960; I.V.:3873.3; IV Piggyback:1100] Out: 1970 [JOINO:6767; Drains:20]    Physical Exam: General: Pt awake/alert/oriented x4 in no acute distress  Abdomen: Soft.  Nondistended. Minimal ttp rlq.  Left TG drain with serous drainage.   No evidence of peritonitis.  No incarcerated hernias.   Problem List:   Principal Problem:   Colitis Active Problems:   Liver masses   Pelvic fluid collection   Fibroid uterus   Hypokalemia   Constipation, chronic   Protein-calorie malnutrition ([place])    Results:   Labs: Results for orders placed or performed during the hospital encounter of 05/13/15 (from the past 48 hour(s))  Prealbumin     Status: Abnormal   Collection Time: 05/15/15 11:03 AM  Result Value Ref Range   Prealbumin 6.5 (L) 18 - 38 mg/dL    Comment: Performed at [facility]  CBC WITH DIFFERENTIAL     Status: Abnormal   Collection Time: 05/16/15  4:38 AM  Result Value Ref Range   WBC 14.7 (H) 4.0 - 10.5 K/uL    Comment: WHITE COUNT CONFIRMED ON SMEAR   RBC 3.52 (L) 3.87 - 5.11 MIL/uL   Hemoglobin 10.0 (L) 12.0 - 15.0 g/dL   HCT 30.6 (L) 36.0 - 46.0 %    MCV 86.9 78.0 - 100.0 fL   MCH 28.4 26.0 - 34.0 pg   MCHC 32.7 30.0 - 36.0 g/dL   RDW 14.0 11.5 - 15.5 %   Platelets 448 (H) 150 - 400 K/uL   Neutrophils Relative % 80 %   Lymphocytes Relative 13 %   Monocytes Relative 6 %   Eosinophils Relative 1 %   Basophils Relative 0 %   Neutro Abs 11.8 (H) 1.7 - 7.7 K/uL   Lymphs Abs 1.9 0.7 - 4.0 K/uL   Monocytes Absolute 0.9 0.1 - 1.0 K/uL   Eosinophils Absolute 0.1 0.0 - 0.7 K/uL   Basophils Absolute 0.0 0.0 - 0.1 K/uL   Smear Review MORPHOLOGY UNREMARKABLE   Comprehensive metabolic panel     Status: Abnormal   Collection Time: 05/16/15  4:38 AM  Result Value Ref Range   Sodium 139 135 - 145 mmol/L   Potassium 3.5 3.5 - 5.1 mmol/L   Chloride 108 101 - 111 mmol/L   CO2 23 22 - 32 mmol/L   Glucose, Bld 92 65 - 99 mg/dL   BUN <5 (L) 6 - 20 mg/dL   Creatinine, Ser 0.57 0.44 - 1.00 mg/dL   Calcium 7.9 (L)  8.9 - 10.3 mg/dL   Total Protein 6.3 (L) 6.5 - 8.1 g/dL   Albumin 2.8 (L) 3.5 - 5.0 g/dL   AST 11 (L) 15 - 41 U/L   ALT 8 (L) 14 - 54 U/L   Alkaline Phosphatase 56 38 - 126 U/L   Total Bilirubin 0.5 0.3 - 1.2 mg/dL   GFR calc non Af Amer >60 >60 mL/min   GFR calc Af Amer >60 >60 mL/min    Comment: (NOTE) The eGFR has been calculated using the CKD EPI equation. This calculation has not been validated in all clinical situations. eGFR's persistently <60 mL/min signify possible Chronic Kidney Disease.    Anion gap 8 5 - 15  Protime-INR     Status: Abnormal   Collection Time: 05/16/15  2:54 PM  Result Value Ref Range   Prothrombin Time 15.4 (H) 11.6 - 15.2 seconds   INR 1.25 0.00 - 1.49  CBC WITH DIFFERENTIAL     Status: Abnormal   Collection Time: 05/17/15  4:39 AM  Result Value Ref Range   WBC 11.2 (H) 4.0 - 10.5 K/uL   RBC 3.49 (L) 3.87 - 5.11 MIL/uL   Hemoglobin 10.0 (L) 12.0 - 15.0 g/dL   HCT 30.3 (L) 36.0 - 46.0 %   MCV 86.8 78.0 - 100.0 fL   MCH 28.7 26.0 - 34.0 pg   MCHC 33.0 30.0 - 36.0 g/dL   RDW 14.0 11.5 - 15.5  %   Platelets 490 (H) 150 - 400 K/uL   Neutrophils Relative % 75 %   Neutro Abs 8.4 (H) 1.7 - 7.7 K/uL   Lymphocytes Relative 18 %   Lymphs Abs 2.0 0.7 - 4.0 K/uL   Monocytes Relative 6 %   Monocytes Absolute 0.7 0.1 - 1.0 K/uL   Eosinophils Relative 1 %   Eosinophils Absolute 0.2 0.0 - 0.7 K/uL   Basophils Relative 0 %   Basophils Absolute 0.0 0.0 - 0.1 K/uL  Comprehensive metabolic panel     Status: Abnormal   Collection Time: 05/17/15  4:39 AM  Result Value Ref Range   Sodium 142 135 - 145 mmol/L   Potassium 3.4 (L) 3.5 - 5.1 mmol/L   Chloride 108 101 - 111 mmol/L   CO2 25 22 - 32 mmol/L   Glucose, Bld 94 65 - 99 mg/dL   BUN <5 (L) 6 - 20 mg/dL   Creatinine, Ser 0.57 0.44 - 1.00 mg/dL   Calcium 8.2 (L) 8.9 - 10.3 mg/dL   Total Protein 6.3 (L) 6.5 - 8.1 g/dL   Albumin 2.9 (L) 3.5 - 5.0 g/dL   AST 11 (L) 15 - 41 U/L   ALT 8 (L) 14 - 54 U/L   Alkaline Phosphatase 54 38 - 126 U/L   Total Bilirubin 0.5 0.3 - 1.2 mg/dL   GFR calc non Af Amer >60 >60 mL/min   GFR calc Af Amer >60 >60 mL/min    Comment: (NOTE) The eGFR has been calculated using the CKD EPI equation. This calculation has not been validated in all clinical situations. eGFR's persistently <60 mL/min signify possible Chronic Kidney Disease.    Anion gap 9 5 - 15    Imaging / Studies: Mr Pelvis W Wo Contrast  05/16/2015  CLINICAL DATA:  Evaluate previously noted liver lesions. Uterine fibroids. Evaluate for sarcoma. EXAM: MRI ABDOMEN AND PELVIS WITHOUT CONTRAST TECHNIQUE: Multiplanar multisequence MR imaging of the abdomen and pelvis was performed. No intravenous contrast was administered. COMPARISON:  05/14/2015  FINDINGS: MRI ABDOMEN FINDINGS Lower chest: Small pleural effusions are noted right greater than left. Hepatobiliary: There are multiple arterial phase enhancing liver lesions identified within both lobes. The largest is in the posterior right lobe of liver measuring 4.3 cm, image 18 of series 1901. This  demonstrates avid arterial enhancement. Multiple arterial phase enhancing lesions are also noted within the left lobe. The largest measures 3.2 cm, image 31 of series 1901. Nonenhancing T2 hyperintense structure within the medial segment of left lobe of liver is favored to represent a simple cyst. This measures 1.9 cm, image 25 of series 3. The gallbladder appears normal. No biliary dilatation. Pancreas:  The pancreas appears normal. Spleen:  Within normal limits in size and appearance. Adrenals/Urinary Tract: No masses identified. No evidence of hydronephrosis. Stomach/Bowel: Visualized portions within the abdomen are unremarkable. Vascular/Lymphatic: No pathologically enlarged lymph nodes identified. No abdominal aortic aneurysm demonstrated. Other:  None. Musculoskeletal:  No suspicious bone lesions identified. MRI PELVIS FINDINGS Urinary Tract: The urinary bladder appears normal. Bowel: Wall thickening and inflammation involving the sigmoid colon is again noted, image 27 of series 13. Fluid collection within the pelvis ventral to the rectum measures 4.2 cm, image 32 of series 13. This is compared with 3.4 cm previously. Vascular/Lymphatic: The iliac vasculature is unremarkable. No adenopathy. Reproductive: Enlarged fibroid uterus is identified. This measures 12.1 x 8.1 x 17.3 cm. This has a volume of 847 cc. There are multiple fibroids in varying locations including subserosal, intramural and submucosal. There is fairly uniform enhancement of these fibroids following the IV administration of contrast. The largest fibroid is subserosal arising from the anterior lower uterine segment and measures 5.6 cm, image 14 of series 14. Arising from the left side of uterus is 86.1 cm fibroid, image 19 of series 11. This is also partially sub serosal. The endometrium is difficult to visualize due to distortion by fibroids. The cervix appears dilated and fluid-filled, image 17 of series 11. Cystic structure within the right  adnexa measures 2.4 cm, image 14 of series 11. This is favored to represent an ovarian cyst. Several small follicles are noted within the left ovary, image 24 series 11. Other: Trace fluid identified within the pelvis. Musculoskeletal: Normal signal identified from within the bone marrow. IMPRESSION: 1. Multifocal arterial phase enhancing lesions are identified within both lobes of liver. In a patient that is at low risk for malignancy these are favored to represent either multiple [place] is versus liver adenomas. Hypervascular liver metastases or primary liver malignancy is considered less favored. Differentiation of FNH from adenoma is recommended for lesions greater than 4 cm. In a patient that is at low risk for either primary liver malignancy or metastatic disease 3 to six-month followup imaging with MRI of the liver performed with Gretta Cool is advised. 2. Enlarged uterus containing multiple enhancing masses. Statistically these likely represent multiple benign uterine fibroids. 3. Imaging findings compatible with sigmoid colitis. 4. Persistent pelvic fluid collection, likely abscess secondary to colitis. When compared with recent CT of the pelvis this fluid collection appears slightly increased in size in the interval. Electronically Signed   By: Kerby Moors M.D.   On: 05/16/2015 12:01   Mr Abdomen W Wo Contrast  05/16/2015  CLINICAL DATA:  Evaluate previously noted liver lesions. Uterine fibroids. Evaluate for sarcoma. EXAM: MRI ABDOMEN AND PELVIS WITHOUT CONTRAST TECHNIQUE: Multiplanar multisequence MR imaging of the abdomen and pelvis was performed. No intravenous contrast was administered. COMPARISON:  05/14/2015 FINDINGS: MRI ABDOMEN FINDINGS Lower chest: Small  pleural effusions are noted right greater than left. Hepatobiliary: There are multiple arterial phase enhancing liver lesions identified within both lobes. The largest is in the posterior right lobe of liver measuring 4.3 cm, image 18 of series  1901. This demonstrates avid arterial enhancement. Multiple arterial phase enhancing lesions are also noted within the left lobe. The largest measures 3.2 cm, image 31 of series 1901. Nonenhancing T2 hyperintense structure within the medial segment of left lobe of liver is favored to represent a simple cyst. This measures 1.9 cm, image 25 of series 3. The gallbladder appears normal. No biliary dilatation. Pancreas:  The pancreas appears normal. Spleen:  Within normal limits in size and appearance. Adrenals/Urinary Tract: No masses identified. No evidence of hydronephrosis. Stomach/Bowel: Visualized portions within the abdomen are unremarkable. Vascular/Lymphatic: No pathologically enlarged lymph nodes identified. No abdominal aortic aneurysm demonstrated. Other:  None. Musculoskeletal:  No suspicious bone lesions identified. MRI PELVIS FINDINGS Urinary Tract: The urinary bladder appears normal. Bowel: Wall thickening and inflammation involving the sigmoid colon is again noted, image 27 of series 13. Fluid collection within the pelvis ventral to the rectum measures 4.2 cm, image 32 of series 13. This is compared with 3.4 cm previously. Vascular/Lymphatic: The iliac vasculature is unremarkable. No adenopathy. Reproductive: Enlarged fibroid uterus is identified. This measures 12.1 x 8.1 x 17.3 cm. This has a volume of 847 cc. There are multiple fibroids in varying locations including subserosal, intramural and submucosal. There is fairly uniform enhancement of these fibroids following the IV administration of contrast. The largest fibroid is subserosal arising from the anterior lower uterine segment and measures 5.6 cm, image 14 of series 14. Arising from the left side of uterus is 86.1 cm fibroid, image 19 of series 11. This is also partially sub serosal. The endometrium is difficult to visualize due to distortion by fibroids. The cervix appears dilated and fluid-filled, image 17 of series 11. Cystic structure within  the right adnexa measures 2.4 cm, image 14 of series 11. This is favored to represent an ovarian cyst. Several small follicles are noted within the left ovary, image 24 series 11. Other: Trace fluid identified within the pelvis. Musculoskeletal: Normal signal identified from within the bone marrow. IMPRESSION: 1. Multifocal arterial phase enhancing lesions are identified within both lobes of liver. In a patient that is at low risk for malignancy these are favored to represent either multiple [place] is versus liver adenomas. Hypervascular liver metastases or primary liver malignancy is considered less favored. Differentiation of FNH from adenoma is recommended for lesions greater than 4 cm. In a patient that is at low risk for either primary liver malignancy or metastatic disease 3 to six-month followup imaging with MRI of the liver performed with Gretta Cool is advised. 2. Enlarged uterus containing multiple enhancing masses. Statistically these likely represent multiple benign uterine fibroids. 3. Imaging findings compatible with sigmoid colitis. 4. Persistent pelvic fluid collection, likely abscess secondary to colitis. When compared with recent CT of the pelvis this fluid collection appears slightly increased in size in the interval. Electronically Signed   By: Kerby Moors M.D.   On: 05/16/2015 12:01    Medications / Allergies:  Scheduled Meds: . bisacodyl  10 mg Rectal Daily  . cefTRIAXone (ROCEPHIN)  IV  2 g Intravenous Q24H  . fluconazole  200 mg Oral Daily  . lip balm  1 application Topical BID  . megestrol  80 mg Oral BID  . metronidazole  500 mg Intravenous Q6H  . [START ON  05/18/2015] pantoprazole  40 mg Oral Daily   Continuous Infusions: . 0.9 % NaCl with KCl 20 mEq / L 50 mL/hr at 05/17/15 0049   PRN Meds:.acetaminophen, acetaminophen, alum & mag hydroxide-simeth, diphenhydrAMINE, HYDROmorphone (DILAUDID) injection, HYDROmorphone (DILAUDID) injection, magic mouthwash, menthol-cetylpyridinium,  methocarbamol (ROBAXIN)  IV, metoprolol, ondansetron (ZOFRAN) IV, ondansetron, phenol, promethazine  Antibiotics: Anti-infectives    Start     Dose/Rate Route Frequency Ordered Stop   05/15/15 1030  fluconazole (DIFLUCAN) tablet 200 mg     200 mg Oral Daily 05/15/15 1025     05/14/15 1200  piperacillin-tazobactam (ZOSYN) IVPB 3.375 g  Status:  Discontinued     3.375 g 12.5 mL/hr over 240 Minutes Intravenous Every 8 hours 05/14/15 0748 05/15/15 1458   05/14/15 0745  piperacillin-tazobactam (ZOSYN) IVPB 3.375 g  Status:  Discontinued     3.375 g 100 mL/hr over 30 Minutes Intravenous 3 times per day 05/14/15 0744 05/14/15 0747   05/14/15 0715  cefTRIAXone (ROCEPHIN) 2 g in dextrose 5 % 50 mL IVPB    Comments:  Pharmacy may adjust dosing strength / duration / interval for maximal efficacy   2 g 100 mL/hr over 30 Minutes Intravenous Every 24 hours 05/14/15 0702     05/14/15 0715  metroNIDAZOLE (FLAGYL) IVPB 500 mg     500 mg 100 mL/hr over 60 Minutes Intravenous Every 6 hours 05/14/15 0702     05/14/15 0345  piperacillin-tazobactam (ZOSYN) IVPB 3.375 g     3.375 g 100 mL/hr over 30 Minutes Intravenous  Once 05/14/15 7116 05/14/15 0546        Assessment/Plan Sigmoid Colitis  Pelvic fluid collection-s/p IR drain 3/27 -tolerating a diet, minimal tenderness, WBC trending down, afebrile.  May change to PO antibiotics and discharge when felt medically stable.  Follow up with Dr. Johney Maine in 2 weeks and drain clinic -she will need a colonoscopy in 6-8 weeks -follow cultures  Liver mass-low suspicion for malignancy on MRI.  Recommended MRI in 3-6 months Uterine fibroids-felt benign on MRI  Erby Pian, ANP-BC [facility] Pager [phone]([id number])   05/17/2015 10:23 AM  I have seen and examined this patient and agree with the assessment and plan.   Matt B. Hassell Done, MD, [facility] [phone] beeper [phone]  05/18/2015 7:51 AM

## 2015-05-18 ENCOUNTER — Other Ambulatory Visit: Payer: Self-pay | Admitting: Surgery

## 2015-05-18 ENCOUNTER — Other Ambulatory Visit: Payer: Self-pay | Admitting: Radiology

## 2015-05-18 DIAGNOSIS — R188 Other ascites: Secondary | ICD-10-CM

## 2015-05-18 LAB — BASIC METABOLIC PANEL
ANION GAP: 9 (ref 5–15)
BUN: 6 mg/dL (ref 6–20)
CHLORIDE: 108 mmol/L (ref 101–111)
CO2: 24 mmol/L (ref 22–32)
Calcium: 8.4 mg/dL — ABNORMAL LOW (ref 8.9–10.3)
Creatinine, Ser: 0.54 mg/dL (ref 0.44–1.00)
GFR calc non Af Amer: >60 mL/min (ref >60)
GLUCOSE: 102 mg/dL — AB (ref 65–99)
POTASSIUM: 3.5 mmol/L (ref 3.5–5.1)
Sodium: 141 mmol/L (ref 135–145)

## 2015-05-18 LAB — CBC
HEMATOCRIT: 30 % — AB (ref 36.0–46.0)
Hemoglobin: 9.9 g/dL — ABNORMAL LOW (ref 12.0–15.0)
MCH: 27.9 pg (ref 26.0–34.0)
MCHC: 33 g/dL (ref 30.0–36.0)
MCV: 84.5 fL (ref 78.0–100.0)
Platelets: 466 10*3/uL — ABNORMAL HIGH (ref 150–400)
RBC: 3.55 MIL/uL — AB (ref 3.87–5.11)
RDW: 13.7 % (ref 11.5–15.5)
WBC: 9.6 10*3/uL (ref 4.0–10.5)

## 2015-05-18 MED ORDER — POTASSIUM CHLORIDE CRYS ER 20 MEQ PO TBCR
40.0000 meq | EXTENDED_RELEASE_TABLET | Freq: Once | ORAL | Status: AC
  Administered 2015-05-18: 40 meq via ORAL
  Filled 2015-05-18: qty 2

## 2015-05-18 MED ORDER — FLUCONAZOLE 100 MG PO TABS: 100.0000 mg | ORAL_TABLET | Freq: Every day | ORAL | Status: DC

## 2015-05-18 MED ORDER — CIPROFLOXACIN HCL 500 MG PO TABS: 500.0000 mg | ORAL_TABLET | Freq: Two times a day (BID) | ORAL | Status: DC

## 2015-05-18 MED ORDER — METRONIDAZOLE 500 MG PO TABS: 500.0000 mg | ORAL_TABLET | Freq: Three times a day (TID) | ORAL | Status: DC

## 2015-05-18 MED ORDER — MEGESTROL ACETATE 40 MG PO TABS: 80.0000 mg | ORAL_TABLET | Freq: Two times a day (BID) | ORAL | Status: DC

## 2015-05-18 MED ORDER — PANTOPRAZOLE SODIUM 40 MG PO TBEC: 40.0000 mg | DELAYED_RELEASE_TABLET | Freq: Every day | ORAL | Status: DC

## 2015-05-18 MED FILL — FLUCONAZOLE 100 MG TABLET: 100 | 10 days supply | Qty: 10 | Fill #0

## 2015-05-18 MED FILL — metroNIDAZOLE 500 MG TABS: 500 | 10 days supply | Qty: 30 | Fill #0

## 2015-05-18 MED FILL — CIPROFLOXACIN HCL 500 MG TA: 500 | 10 days supply | Qty: 20 | Fill #0

## 2015-05-18 MED FILL — PANTOPRAZOLE SOD DR 40 MG T: 40 | 30 days supply | Qty: 30 | Fill #0

## 2015-05-18 MED FILL — MEGESTROL 40 MG TABLET: 40 | 30 days supply | Qty: 120 | Fill #0

## 2015-05-18 NOTE — Discharge Instructions (Signed)

## 2015-05-18 NOTE — Care Management Note (Signed)
Case Management Note  Patient Details  Name: DAIZAH PERISHO MRN: [id number] Date of Birth: 09-Dec-1970  Subjective/Objective:       Admitted with colitis and intrabd abscess             Action/Plan: Discharge planning, spoke with patient at bedside. 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 States she can afford medication if less than $100, all medications likely less than $50. Patient agreeable to [facility] appt, contacted clinic, appt arranged. Patient will f/u with drain clinic, education on care of drain provided by staff, patient feels comfortable with management of drain.  Expected Discharge Date:                  Expected Discharge Plan:  Home/Self Care  In-House Referral:  PCP / [facility]  Discharge planning Services  CM Consult, Follow-up appt scheduled, Medication Assistance  Post Acute Care Choice:  NA Choice offered to:  NA  DME Arranged:  N/A DME Agency:  NA  HH Arranged:  NA HH Agency:  NA  Status of Service:  Completed, signed off  Medicare Important Message Given:    Date Medicare IM Given:    Medicare IM give by:    Date Additional Medicare IM Given:    Additional Medicare Important Message give by:     If discussed at [place] of Stay Meetings, dates discussed:    Additional Comments:  Guadalupe Maple, RN 05/18/2015, 10:57 AM

## 2015-05-18 NOTE — Discharge Summary (Signed)
Physician Discharge Summary  Cheryl Grant [id number] DOB: 07-Sep-1970 DOA: 05/13/2015  PCP: No primary care provider on file.  Admit date: 05/13/2015 Discharge date: 05/18/2015  Time spent: 45 minutes  Recommendations for Outpatient Follow-up:  Patient will be discharged to home.  Patient will need to follow up with primary care provider within one week of discharge. Follow up with drain clinic in one week.  Follow up with surgery in 2 weeks.  Follow up with gynecology in 1-2 weeks. Patient should continue medications as prescribed.  Patient should follow a Regular diet.   Discharge Diagnoses:  Principal Problem:   Colitis Active Problems:   Liver masses   Pelvic fluid collection   Fibroid uterus   Hypokalemia   Constipation, chronic   Protein-calorie malnutrition ([place])   Discharge Condition: Stable  Diet recommendation: Regular  Filed Weights   05/16/15 1200  Weight: 56.246 kg (124 lb)    History of present illness:  on 05/14/2015 by Dr. Gerri Lins Cheryl Grant is a 45 y.o. female with no past medical history who comes to emergency department due to abdominal pain since early morning yesterday morning associated with constipation.  Per patient, she woke up yesterday around 6 in the morning with intense left lower quadrant pain. She states that she felt like she had an obstruction, because she was unable to have a bowel movement. She said that she took 3 different types of medications for constipation and had a bowel movement around 3 PM showing very hard stools. The patient stated that she felt like she was passing a rock. Associated symptoms have been abdominal distention, 3 episodes of nausea with emesis and fever.  When seen in the emergency department, the patient stated that she felt a lot better after she was given analgesics. Workup shows leukocytosis of 20 9.3K and a CT scan of the abdomen showing findings consistent with colitis and the retro-uterine  abscess or fluid collection.  Hospital Course:  Sepsis secondary to colitis/ Abdominal pain secondary to colitis/obstipation -Upon admission, patient was tachycardic with leukocytosis and elevated lactic acid -Leukocytosis and lactic acid improving -CT of the abdomen pelvis: Diarrheal state findings most compatible with colitis of the rectosigmoid. A small loculated fluid collection/abscess noted in the retrouterine space -Initially placed on ceftriaxone and Flagyl -Gen. surgery consulted recommended IR for possible drain placement. -Spoke with IR 05/14/15, felt that abscess/fluid collection was small for drain placement. Perhaps will resolve within the next couple of days. Repeat CT scan in several days. -Spoke with Dr. Clovis Riley, felt abscess/fluid collection is increasing in size on MRI -IR placed drain 05/16/15 -Patient will need colonoscopy after colitis resolves- 6-8 weeks -Will discharge patient with cipro and flagyl  -Patient will need to follow up with drain clinic in one week -Patient will need follow up with surgery in 2 weeks  Liver mass -Noted on CT scan. Multiple enhancing hepatic lesions. Nonemergent MRI recommended. -MRI abd/pelvis: possible focal nodular hyperplasia vs adenoma. Recommended 3-6 month MRI with Eovist for follow up  Uterine fibroid -Patient will need gynecologic follow-up. Per patient has been long standing problem. -Bleeding, however, hemoglobin stable -Patient states she was having regular periods until 3-4 months ago, then started having periods twice/month -Spoke with Dr. Nehemiah Settle [facility]) recommended outpatient follow and starting megace 80mg  BID until seen by gynecology  Hypokalemia -Replace and continue to monitor BMP  Chronic constipation -Patient was taking bowel regimen at home -Able to have several BMs here  Hypoalbuminemia -Albumin 2.6  -  Nutrition consulted  Procedures  CT guided drainage of pelvic fluid collection   Consults  General  surgery Interventional radiology  Gynecology, via phone  Discharge Exam: Filed Vitals:   05/17/15 2115 05/18/15 0544  BP: 116/70 112/64  Pulse: 67 80  Temp: 98.8 F (37.1 C) 98.4 F (36.9 C)  Resp: 18 18   Exam  General: Well developed, well nourished, NAD  HEENT: NCAT, mucous membranes moist.   Cardiovascular: S1 S2 auscultated, no murmurs, RRR.  Respiratory: Clear to auscultation bilaterally  Abdomen: Soft, Mild TTP RLQ, nondistended, + bowel sounds, enlarged uterus. Drain in place with yellow fluid  Extremities: warm dry without cyanosis clubbing or edema  Neuro: AAOx3, nonfocal  Psych: Normal affect and demeanor, pleasant  Discharge Instructions      Discharge Instructions    Discharge instructions    Complete by:  As directed   Patient will be discharged to home.  Patient will need to follow up with primary care provider within one week of discharge. Follow up with drain clinic in one week.  Follow up with surgery in 2 weeks.  Follow up with gynecology in 1-2 weeks. Patient should continue medications as prescribed.  Patient should follow a Regular diet.            Medication List    TAKE these medications        ciprofloxacin 500 MG tablet  Commonly known as:  CIPRO  Take 1 tablet (500 mg total) by mouth 2 (two) times daily.     fluconazole 100 MG tablet  Commonly known as:  DIFLUCAN  Take 1 tablet (100 mg total) by mouth daily.     megestrol 40 MG tablet  Commonly known as:  MEGACE  Take 2 tablets (80 mg total) by mouth 2 (two) times daily.     metroNIDAZOLE 500 MG tablet  Commonly known as:  FLAGYL  Take 1 tablet (500 mg total) by mouth 3 (three) times daily.     pantoprazole 40 MG tablet  Commonly known as:  PROTONIX  Take 1 tablet (40 mg total) by mouth daily.       Allergies  Allergen Reactions  . Ibuprofen Hives   Follow-up Information    Follow up with Adin Hector., MD On 05/27/2015.   Specialty:  General Surgery   Why:   arrive by 2PM for a 2:30PM drain check with your surgeon   Contact information:   [place] [address] [phone]       Follow up with [facility] . Schedule an appointment as soon as possible for a visit in 1 week.   Why:  Drain clinic   Contact information:   [phone]      Follow up with [place], [place], DO. Schedule an appointment as soon as possible for a visit in 1 week.   Specialty:  Family Medicine   Why:  Uterine fibroid   Contact information:   [place] [address] [phone]        The results of significant diagnostics from this hospitalization (including imaging, microbiology, ancillary and laboratory) are listed below for reference.    Significant Diagnostic Studies: Mr Pelvis W Wo Contrast  05/16/2015  CLINICAL DATA:  Evaluate previously noted liver lesions. Uterine fibroids. Evaluate for sarcoma. EXAM: MRI ABDOMEN AND PELVIS WITHOUT CONTRAST TECHNIQUE: Multiplanar multisequence MR imaging of the abdomen and pelvis was performed. No intravenous contrast was administered. COMPARISON:  05/14/2015 FINDINGS: MRI  ABDOMEN FINDINGS Lower chest: Small pleural effusions are noted right greater than left. Hepatobiliary: There are multiple arterial phase enhancing liver lesions identified within both lobes. The largest is in the posterior right lobe of liver measuring 4.3 cm, image 18 of series 1901. This demonstrates avid arterial enhancement. Multiple arterial phase enhancing lesions are also noted within the left lobe. The largest measures 3.2 cm, image 31 of series 1901. Nonenhancing T2 hyperintense structure within the medial segment of left lobe of liver is favored to represent a simple cyst. This measures 1.9 cm, image 25 of series 3. The gallbladder appears normal. No biliary dilatation. Pancreas:  The pancreas appears normal. Spleen:  Within normal limits in size and appearance. Adrenals/Urinary Tract: No masses  identified. No evidence of hydronephrosis. Stomach/Bowel: Visualized portions within the abdomen are unremarkable. Vascular/Lymphatic: No pathologically enlarged lymph nodes identified. No abdominal aortic aneurysm demonstrated. Other:  None. Musculoskeletal:  No suspicious bone lesions identified. MRI PELVIS FINDINGS Urinary Tract: The urinary bladder appears normal. Bowel: Wall thickening and inflammation involving the sigmoid colon is again noted, image 27 of series 13. Fluid collection within the pelvis ventral to the rectum measures 4.2 cm, image 32 of series 13. This is compared with 3.4 cm previously. Vascular/Lymphatic: The iliac vasculature is unremarkable. No adenopathy. Reproductive: Enlarged fibroid uterus is identified. This measures 12.1 x 8.1 x 17.3 cm. This has a volume of 847 cc. There are multiple fibroids in varying locations including subserosal, intramural and submucosal. There is fairly uniform enhancement of these fibroids following the IV administration of contrast. The largest fibroid is subserosal arising from the anterior lower uterine segment and measures 5.6 cm, image 14 of series 14. Arising from the left side of uterus is 86.1 cm fibroid, image 19 of series 11. This is also partially sub serosal. The endometrium is difficult to visualize due to distortion by fibroids. The cervix appears dilated and fluid-filled, image 17 of series 11. Cystic structure within the right adnexa measures 2.4 cm, image 14 of series 11. This is favored to represent an ovarian cyst. Several small follicles are noted within the left ovary, image 24 series 11. Other: Trace fluid identified within the pelvis. Musculoskeletal: Normal signal identified from within the bone marrow. IMPRESSION: 1. Multifocal arterial phase enhancing lesions are identified within both lobes of liver. In a patient that is at low risk for malignancy these are favored to represent either multiple [place] is versus liver adenomas.  Hypervascular liver metastases or primary liver malignancy is considered less favored. Differentiation of FNH from adenoma is recommended for lesions greater than 4 cm. In a patient that is at low risk for either primary liver malignancy or metastatic disease 3 to six-month followup imaging with MRI of the liver performed with Gretta Cool is advised. 2. Enlarged uterus containing multiple enhancing masses. Statistically these likely represent multiple benign uterine fibroids. 3. Imaging findings compatible with sigmoid colitis. 4. Persistent pelvic fluid collection, likely abscess secondary to colitis. When compared with recent CT of the pelvis this fluid collection appears slightly increased in size in the interval. Electronically Signed   By: Kerby Moors M.D.   On: 05/16/2015 12:01   Mr Abdomen W Wo Contrast  05/16/2015  CLINICAL DATA:  Evaluate previously noted liver lesions. Uterine fibroids. Evaluate for sarcoma. EXAM: MRI ABDOMEN AND PELVIS WITHOUT CONTRAST TECHNIQUE: Multiplanar multisequence MR imaging of the abdomen and pelvis was performed. No intravenous contrast was administered. COMPARISON:  05/14/2015 FINDINGS: MRI ABDOMEN FINDINGS Lower chest: Small pleural effusions  are noted right greater than left. Hepatobiliary: There are multiple arterial phase enhancing liver lesions identified within both lobes. The largest is in the posterior right lobe of liver measuring 4.3 cm, image 18 of series 1901. This demonstrates avid arterial enhancement. Multiple arterial phase enhancing lesions are also noted within the left lobe. The largest measures 3.2 cm, image 31 of series 1901. Nonenhancing T2 hyperintense structure within the medial segment of left lobe of liver is favored to represent a simple cyst. This measures 1.9 cm, image 25 of series 3. The gallbladder appears normal. No biliary dilatation. Pancreas:  The pancreas appears normal. Spleen:  Within normal limits in size and appearance. Adrenals/Urinary  Tract: No masses identified. No evidence of hydronephrosis. Stomach/Bowel: Visualized portions within the abdomen are unremarkable. Vascular/Lymphatic: No pathologically enlarged lymph nodes identified. No abdominal aortic aneurysm demonstrated. Other:  None. Musculoskeletal:  No suspicious bone lesions identified. MRI PELVIS FINDINGS Urinary Tract: The urinary bladder appears normal. Bowel: Wall thickening and inflammation involving the sigmoid colon is again noted, image 27 of series 13. Fluid collection within the pelvis ventral to the rectum measures 4.2 cm, image 32 of series 13. This is compared with 3.4 cm previously. Vascular/Lymphatic: The iliac vasculature is unremarkable. No adenopathy. Reproductive: Enlarged fibroid uterus is identified. This measures 12.1 x 8.1 x 17.3 cm. This has a volume of 847 cc. There are multiple fibroids in varying locations including subserosal, intramural and submucosal. There is fairly uniform enhancement of these fibroids following the IV administration of contrast. The largest fibroid is subserosal arising from the anterior lower uterine segment and measures 5.6 cm, image 14 of series 14. Arising from the left side of uterus is 86.1 cm fibroid, image 19 of series 11. This is also partially sub serosal. The endometrium is difficult to visualize due to distortion by fibroids. The cervix appears dilated and fluid-filled, image 17 of series 11. Cystic structure within the right adnexa measures 2.4 cm, image 14 of series 11. This is favored to represent an ovarian cyst. Several small follicles are noted within the left ovary, image 24 series 11. Other: Trace fluid identified within the pelvis. Musculoskeletal: Normal signal identified from within the bone marrow. IMPRESSION: 1. Multifocal arterial phase enhancing lesions are identified within both lobes of liver. In a patient that is at low risk for malignancy these are favored to represent either multiple [place] is versus liver  adenomas. Hypervascular liver metastases or primary liver malignancy is considered less favored. Differentiation of FNH from adenoma is recommended for lesions greater than 4 cm. In a patient that is at low risk for either primary liver malignancy or metastatic disease 3 to six-month followup imaging with MRI of the liver performed with Gretta Cool is advised. 2. Enlarged uterus containing multiple enhancing masses. Statistically these likely represent multiple benign uterine fibroids. 3. Imaging findings compatible with sigmoid colitis. 4. Persistent pelvic fluid collection, likely abscess secondary to colitis. When compared with recent CT of the pelvis this fluid collection appears slightly increased in size in the interval. Electronically Signed   By: Kerby Moors M.D.   On: 05/16/2015 12:01   Ct Abdomen Pelvis W Contrast  05/14/2015  CLINICAL DATA:  45 year old female with abdominal pain standard in the left lower quadrant. Abdominal cramping and leukocytosis. EXAM: CT ABDOMEN AND PELVIS WITH CONTRAST TECHNIQUE: Multidetector CT imaging of the abdomen and pelvis was performed using the standard protocol following bolus administration of intravenous contrast. CONTRAST:  73mL OMNIPAQUE IOHEXOL 300 MG/ML SOLN, 186mL ISOVUE-300  IOPAMIDOL (ISOVUE-300) INJECTION 61% COMPARISON:  None FINDINGS: The visualized lung bases are clear. No intra-abdominal free air. Trace free fluid within the pelvis. There multiple enhancing liver lesions. The largest lesion is located in the right lobe of the liver and measures approximately 5.0 x 4.0 cm and appears to have a central scar. Differentials include hemangioma, adenoma, focal nodular hyperplasia. Hepatocellular carcinoma or metastatic disease are not excluded. MRI without and with contrast is recommended for further characterization. There is a 2.3 x 1.4 cm hypodense lesion in the left lobe of the liver (segment IV B), possibly a cyst. Stop the gallbladder, pancreas, spleen,  adrenal glands all, kidneys, visualized ureters, and urinary bladder appear unremarkable. The uterus is enlarged and myomatous. There is a 2.5 x 1.5 cm cystic structure in the region of the right ovary, likely a dominant follicle/cyst. There is a 2.5 x 4.6 cm loculated fluid collection in the cul-de-sac (pouch of [place]) most compatible with an abscess. There is apparent segmental thickening of the rectosigmoid wall with haziness and enhancement of the mucosa concerning for colitis. Loose stool noted within the colon compatible with diarrheal state. There is no evidence of bowel obstruction. Normal appendix. The abdominal aorta and IVC appear unremarkable. No portal venous gas identified. There is no adenopathy. The abdominal wall soft tissues appear unremarkable. The osseous structures are intact. IMPRESSION: Diarrheal state with findings most compatible with colitis of the rectosigmoid. A small loculated fluid collection/abscess noted in the retrouterine space. Multiple enhancing hepatic lesions as described. Nonemergent MRI without and with contrast is recommended for further characterization. Enlarged and myomatous uterus. Right ovarian probable dominant follicle/cyst. Electronically Signed   By: Anner Crete M.D.   On: 05/14/2015 03:29   Ct Image Guided Drainage By Percutaneous Catheter  05/17/2015  INDICATION: Pelvic abscess EXAM: CT IMAGE GUIDED DRAINAGE BY PERCUTANEOUS CATHETER MEDICATIONS: The patient is currently admitted to the hospital and receiving intravenous antibiotics. The antibiotics were administered within an appropriate time frame prior to the initiation of the procedure. ANESTHESIA/SEDATION: Fentanyl 5 mcg IV; Versed 100 mg IV Moderate Sedation Time:  21 The patient was continuously monitored during the procedure by the interventional radiology nurse under my direct supervision. COMPLICATIONS: None immediate. PROCEDURE: Informed written consent was obtained from the patient after a  thorough discussion of the procedural risks, benefits and alternatives. All questions were addressed. Maximal Sterile Barrier Technique was utilized including caps, mask, sterile gowns, sterile gloves, sterile drape, hand hygiene and skin antiseptic. A timeout was performed prior to the initiation of the procedure. The left gluteal region was prepped and draped in a sterile fashion. Under CT guidance, an 18 gauge needle was inserted into the pelvic abscess via left trans gluteal approach. Ten [place] dilator followed by a 10 [place] drain were inserted. It was looped and string fixed in the fluid collection then sewn to the skin. Frank pus was aspirated FINDINGS: Imaging confirms placement of a left 10 [place] trans gluteal pelvic abscess. IMPRESSION: Successful trans gluteal 10 French left pelvic abscess drain Electronically Signed   By: Marybelle Killings M.D.   On: 05/17/2015 11:11    Microbiology: Recent Results (from the past 240 hour(s))  Culture, routine-abscess     Status: None (Preliminary result)   Collection Time: 05/16/15  5:30 PM  Result Value Ref Range Status   Specimen Description PELVIS LEFT BUTTOCK  Final   Special Requests NONE  Final   Gram Stain PENDING  Incomplete   Culture   Final  NO GROWTH 1 DAY Performed at [facility]    Report Status PENDING  Incomplete  Body fluid culture     Status: None (Preliminary result)   Collection Time: 05/18/15  1:19 AM  Result Value Ref Range Status   Specimen Description JP DRAINAGE  Final   Special Requests Normal  Final   Gram Stain   Final    RARE WBC PRESENT,BOTH PMN AND MONONUCLEAR NO ORGANISMS SEEN    Culture PENDING  Incomplete   Report Status PENDING  Incomplete     Labs: Basic Metabolic Panel:  Recent Labs Lab 05/14/15 0035 05/15/15 0406 05/16/15 0438 05/17/15 0439 05/18/15 0426  NA 137 140 139 142 141  K 3.3* 3.8 3.5 3.4* 3.5  CL 102 109 108 108 108  CO2 26 24 23 25 24   GLUCOSE 138* 100* 92 94 102*  BUN 7  6 <5* <5* 6  CREATININE 0.69 0.53 0.57 0.57 0.54  CALCIUM 8.7* 7.7* 7.9* 8.2* 8.4*  MG  --  2.0  --   --   --   PHOS  --  1.9*  --   --   --    Liver Function Tests:  Recent Labs Lab 05/14/15 0035 05/15/15 0406 05/16/15 0438 05/17/15 0439  AST 16 12* 11* 11*  ALT 10* 8* 8* 8*  ALKPHOS 69 54 56 54  BILITOT 1.1 0.3 0.5 0.5  PROT 7.7 6.2* 6.3* 6.3*  ALBUMIN 3.4* 2.6* 2.8* 2.9*   No results for input(s): LIPASE, AMYLASE in the last 168 hours. No results for input(s): AMMONIA in the last 168 hours. CBC:  Recent Labs Lab 05/14/15 0035 05/15/15 0406 05/16/15 0438 05/17/15 0439 05/18/15 0426  WBC 29.3* 20.6* 14.7* 11.2* 9.6  NEUTROABS 27.2* 17.5* 11.8* 8.4*  --   HGB 11.4* 9.6* 10.0* 10.0* 9.9*  HCT 34.2* 29.2* 30.6* 30.3* 30.0*  MCV 86.1 87.2 86.9 86.8 84.5  PLT 484* 397 448* 490* 466*   Cardiac Enzymes: No results for input(s): CKTOTAL, CKMB, CKMBINDEX, TROPONINI in the last 168 hours. BNP: BNP (last 3 results) No results for input(s): BNP in the last 8760 hours.  ProBNP (last 3 results) No results for input(s): PROBNP in the last 8760 hours.  CBG: No results for input(s): GLUCAP in the last 168 hours.     SignedCristal Ford  [facility] 05/18/2015, 9:50 AM

## 2015-05-18 NOTE — Progress Notes (Signed)
Patient ID: Cheryl Grant, female   DOB: May 04, 1970, 45 y.o.   MRN: [id number]     [facility]      [address] [place], [place] [address]    Phone: [phone] FAX: [phone]     Subjective: 65m serous drainage.  Afebrile.  WBC normal.  No pain.  Still with some diarrhea. Tolerating POs.   Objective:  Vital signs:  Filed Vitals:   05/16/15 2140 05/17/15 1445 05/17/15 2115 05/18/15 0544  BP: 107/79 112/66 116/70 112/64  Pulse: 87 81 67 80  Temp: 98.9 F (37.2 C) 98.7 F (37.1 C) 98.8 F (37.1 C) 98.4 F (36.9 C)  TempSrc: Oral Oral Oral Oral  Resp: _0 Height:      Weight:      SpO2: 100% 99% 99% 99%    Last BM Date: 05/17/15  Intake/Output   Yesterday:  03/28 0701 - 03/29 0700 In: 1920 [P.O.:720; I.V.:1000; IV Piggyback:200] Out: 40 [Drains:40] This shift:    I/O last 3 completed shifts: In: 3350 [P.O.:1200; I.V.:1600; IV Piggyback:550] Out: 492[Urine:400; Drains:60]    Physical Exam: General: Pt awake/alert/oriented x4 in no acute distress  Abdomen: Soft. Nondistended. Minimal ttp rlq. Left TG drain with serous drainage. No evidence of peritonitis. No incarcerated hernias.    Problem List:   Principal Problem:   Colitis Active Problems:   Liver masses   Pelvic fluid collection   Fibroid uterus   Hypokalemia   Constipation, chronic   Protein-calorie malnutrition (HCC)    Results:   Labs: Results for orders placed or performed during the hospital encounter of 05/13/15 (from the past 48 hour(s))  Protime-INR     Status: Abnormal   Collection Time: 05/16/15  2:54 PM  Result Value Ref Range   Prothrombin Time 15.4 (H) 11.6 - 15.2 seconds   INR 1.25 0.00 - 1.49  Culture, routine-abscess     Status: None (Preliminary result)   Collection Time: 05/16/15  5:30 PM  Result Value Ref Range   Specimen Description PELVIS LEFT BUTTOCK    Special Requests NONE    Gram Stain PENDING     Culture      NO GROWTH 1 DAY Performed at [facility]   Report Status PENDING   CBC WITH DIFFERENTIAL     Status: Abnormal   Collection Time: 05/17/15  4:39 AM  Result Value Ref Range   WBC 11.2 (H) 4.0 - 10.5 K/uL   RBC 3.49 (L) 3.87 - 5.11 MIL/uL   Hemoglobin 10.0 (L) 12.0 - 15.0 g/dL   HCT 30.3 (L) 36.0 - 46.0 %   MCV 86.8 78.0 - 100.0 fL   MCH 28.7 26.0 - 34.0 pg   MCHC 33.0 30.0 - 36.0 g/dL   RDW 14.0 11.5 - 15.5 %   Platelets 490 (H) 150 - 400 K/uL   Neutrophils Relative % 75 %   Neutro Abs 8.4 (H) 1.7 - 7.7 K/uL   Lymphocytes Relative 18 %   Lymphs Abs 2.0 0.7 - 4.0 K/uL   Monocytes Relative 6 %   Monocytes Absolute 0.7 0.1 - 1.0 K/uL   Eosinophils Relative 1 %   Eosinophils Absolute 0.2 0.0 - 0.7 K/uL   Basophils Relative 0 %   Basophils Absolute 0.0 0.0 - 0.1 K/uL  Comprehensive metabolic panel     Status: Abnormal   Collection Time: 05/17/15  4:39 AM  Result Value Ref Range  Sodium 142 135 - 145 mmol/L   Potassium 3.4 (L) 3.5 - 5.1 mmol/L   Chloride 108 101 - 111 mmol/L   CO2 25 22 - 32 mmol/L   Glucose, Bld 94 65 - 99 mg/dL   BUN <5 (L) 6 - 20 mg/dL   Creatinine, Ser 0.57 0.44 - 1.00 mg/dL   Calcium 8.2 (L) 8.9 - 10.3 mg/dL   Total Protein 6.3 (L) 6.5 - 8.1 g/dL   Albumin 2.9 (L) 3.5 - 5.0 g/dL   AST 11 (L) 15 - 41 U/L   ALT 8 (L) 14 - 54 U/L   Alkaline Phosphatase 54 38 - 126 U/L   Total Bilirubin 0.5 0.3 - 1.2 mg/dL   GFR calc non Af Amer >60 >60 mL/min   GFR calc Af Amer >60 >60 mL/min    Comment: (NOTE) The eGFR has been calculated using the CKD EPI equation. This calculation has not been validated in all clinical situations. eGFR's persistently <60 mL/min signify possible Chronic Kidney Disease.    Anion gap 9 5 - 15  Body fluid culture     Status: None (Preliminary result)   Collection Time: 05/18/15  1:19 AM  Result Value Ref Range   Specimen Description JP DRAINAGE    Special Requests Normal    Gram Stain      RARE WBC  PRESENT,BOTH PMN AND MONONUCLEAR NO ORGANISMS SEEN    Culture PENDING    Report Status PENDING   CBC     Status: Abnormal   Collection Time: 05/18/15  4:26 AM  Result Value Ref Range   WBC 9.6 4.0 - 10.5 K/uL   RBC 3.55 (L) 3.87 - 5.11 MIL/uL   Hemoglobin 9.9 (L) 12.0 - 15.0 g/dL   HCT 30.0 (L) 36.0 - 46.0 %   MCV 84.5 78.0 - 100.0 fL   MCH 27.9 26.0 - 34.0 pg   MCHC 33.0 30.0 - 36.0 g/dL   RDW 13.7 11.5 - 15.5 %   Platelets 466 (H) 150 - 400 K/uL  Basic metabolic panel     Status: Abnormal   Collection Time: 05/18/15  4:26 AM  Result Value Ref Range   Sodium 141 135 - 145 mmol/L   Potassium 3.5 3.5 - 5.1 mmol/L   Chloride 108 101 - 111 mmol/L   CO2 24 22 - 32 mmol/L   Glucose, Bld 102 (H) 65 - 99 mg/dL   BUN 6 6 - 20 mg/dL   Creatinine, Ser 0.54 0.44 - 1.00 mg/dL   Calcium 8.4 (L) 8.9 - 10.3 mg/dL   GFR calc non Af Amer >60 >60 mL/min   GFR calc Af Amer >60 >60 mL/min    Comment: (NOTE) The eGFR has been calculated using the CKD EPI equation. This calculation has not been validated in all clinical situations. eGFR's persistently <60 mL/min signify possible Chronic Kidney Disease.    Anion gap 9 5 - 15    Imaging / Studies: Mr Pelvis W Wo Contrast  05/16/2015  CLINICAL DATA:  Evaluate previously noted liver lesions. Uterine fibroids. Evaluate for sarcoma. EXAM: MRI ABDOMEN AND PELVIS WITHOUT CONTRAST TECHNIQUE: Multiplanar multisequence MR imaging of the abdomen and pelvis was performed. No intravenous contrast was administered. COMPARISON:  05/14/2015 FINDINGS: MRI ABDOMEN FINDINGS Lower chest: Small pleural effusions are noted right greater than left. Hepatobiliary: There are multiple arterial phase enhancing liver lesions identified within both lobes. The largest is in the posterior right lobe of liver measuring 4.3 cm, image 18  of series 1901. This demonstrates avid arterial enhancement. Multiple arterial phase enhancing lesions are also noted within the left lobe. The  largest measures 3.2 cm, image 31 of series 1901. Nonenhancing T2 hyperintense structure within the medial segment of left lobe of liver is favored to represent a simple cyst. This measures 1.9 cm, image 25 of series 3. The gallbladder appears normal. No biliary dilatation. Pancreas:  The pancreas appears normal. Spleen:  Within normal limits in size and appearance. Adrenals/Urinary Tract: No masses identified. No evidence of hydronephrosis. Stomach/Bowel: Visualized portions within the abdomen are unremarkable. Vascular/Lymphatic: No pathologically enlarged lymph nodes identified. No abdominal aortic aneurysm demonstrated. Other:  None. Musculoskeletal:  No suspicious bone lesions identified. MRI PELVIS FINDINGS Urinary Tract: The urinary bladder appears normal. Bowel: Wall thickening and inflammation involving the sigmoid colon is again noted, image 27 of series 13. Fluid collection within the pelvis ventral to the rectum measures 4.2 cm, image 32 of series 13. This is compared with 3.4 cm previously. Vascular/Lymphatic: The iliac vasculature is unremarkable. No adenopathy. Reproductive: Enlarged fibroid uterus is identified. This measures 12.1 x 8.1 x 17.3 cm. This has a volume of 847 cc. There are multiple fibroids in varying locations including subserosal, intramural and submucosal. There is fairly uniform enhancement of these fibroids following the IV administration of contrast. The largest fibroid is subserosal arising from the anterior lower uterine segment and measures 5.6 cm, image 14 of series 14. Arising from the left side of uterus is 86.1 cm fibroid, image 19 of series 11. This is also partially sub serosal. The endometrium is difficult to visualize due to distortion by fibroids. The cervix appears dilated and fluid-filled, image 17 of series 11. Cystic structure within the right adnexa measures 2.4 cm, image 14 of series 11. This is favored to represent an ovarian cyst. Several small follicles are  noted within the left ovary, image 24 series 11. Other: Trace fluid identified within the pelvis. Musculoskeletal: Normal signal identified from within the bone marrow. IMPRESSION: 1. Multifocal arterial phase enhancing lesions are identified within both lobes of liver. In a patient that is at low risk for malignancy these are favored to represent either multiple Allgood is versus liver adenomas. Hypervascular liver metastases or primary liver malignancy is considered less favored. Differentiation of FNH from adenoma is recommended for lesions greater than 4 cm. In a patient that is at low risk for either primary liver malignancy or metastatic disease 3 to six-month followup imaging with MRI of the liver performed with Gretta Cool is advised. 2. Enlarged uterus containing multiple enhancing masses. Statistically these likely represent multiple benign uterine fibroids. 3. Imaging findings compatible with sigmoid colitis. 4. Persistent pelvic fluid collection, likely abscess secondary to colitis. When compared with recent CT of the pelvis this fluid collection appears slightly increased in size in the interval. Electronically Signed   By: Kerby Moors M.D.   On: 05/16/2015 12:01   Mr Abdomen W Wo Contrast  05/16/2015  CLINICAL DATA:  Evaluate previously noted liver lesions. Uterine fibroids. Evaluate for sarcoma. EXAM: MRI ABDOMEN AND PELVIS WITHOUT CONTRAST TECHNIQUE: Multiplanar multisequence MR imaging of the abdomen and pelvis was performed. No intravenous contrast was administered. COMPARISON:  05/14/2015 FINDINGS: MRI ABDOMEN FINDINGS Lower chest: Small pleural effusions are noted right greater than left. Hepatobiliary: There are multiple arterial phase enhancing liver lesions identified within both lobes. The largest is in the posterior right lobe of liver measuring 4.3 cm, image 18 of series 1901. This demonstrates avid arterial  enhancement. Multiple arterial phase enhancing lesions are also noted within the left  lobe. The largest measures 3.2 cm, image 31 of series 1901. Nonenhancing T2 hyperintense structure within the medial segment of left lobe of liver is favored to represent a simple cyst. This measures 1.9 cm, image 25 of series 3. The gallbladder appears normal. No biliary dilatation. Pancreas:  The pancreas appears normal. Spleen:  Within normal limits in size and appearance. Adrenals/Urinary Tract: No masses identified. No evidence of hydronephrosis. Stomach/Bowel: Visualized portions within the abdomen are unremarkable. Vascular/Lymphatic: No pathologically enlarged lymph nodes identified. No abdominal aortic aneurysm demonstrated. Other:  None. Musculoskeletal:  No suspicious bone lesions identified. MRI PELVIS FINDINGS Urinary Tract: The urinary bladder appears normal. Bowel: Wall thickening and inflammation involving the sigmoid colon is again noted, image 27 of series 13. Fluid collection within the pelvis ventral to the rectum measures 4.2 cm, image 32 of series 13. This is compared with 3.4 cm previously. Vascular/Lymphatic: The iliac vasculature is unremarkable. No adenopathy. Reproductive: Enlarged fibroid uterus is identified. This measures 12.1 x 8.1 x 17.3 cm. This has a volume of 847 cc. There are multiple fibroids in varying locations including subserosal, intramural and submucosal. There is fairly uniform enhancement of these fibroids following the IV administration of contrast. The largest fibroid is subserosal arising from the anterior lower uterine segment and measures 5.6 cm, image 14 of series 14. Arising from the left side of uterus is 86.1 cm fibroid, image 19 of series 11. This is also partially sub serosal. The endometrium is difficult to visualize due to distortion by fibroids. The cervix appears dilated and fluid-filled, image 17 of series 11. Cystic structure within the right adnexa measures 2.4 cm, image 14 of series 11. This is favored to represent an ovarian cyst. Several small  follicles are noted within the left ovary, image 24 series 11. Other: Trace fluid identified within the pelvis. Musculoskeletal: Normal signal identified from within the bone marrow. IMPRESSION: 1. Multifocal arterial phase enhancing lesions are identified within both lobes of liver. In a patient that is at low risk for malignancy these are favored to represent either multiple Blaine is versus liver adenomas. Hypervascular liver metastases or primary liver malignancy is considered less favored. Differentiation of FNH from adenoma is recommended for lesions greater than 4 cm. In a patient that is at low risk for either primary liver malignancy or metastatic disease 3 to six-month followup imaging with MRI of the liver performed with Gretta Cool is advised. 2. Enlarged uterus containing multiple enhancing masses. Statistically these likely represent multiple benign uterine fibroids. 3. Imaging findings compatible with sigmoid colitis. 4. Persistent pelvic fluid collection, likely abscess secondary to colitis. When compared with recent CT of the pelvis this fluid collection appears slightly increased in size in the interval. Electronically Signed   By: Kerby Moors M.D.   On: 05/16/2015 12:01   Ct Image Guided Drainage By Percutaneous Catheter  05/17/2015  INDICATION: Pelvic abscess EXAM: CT IMAGE GUIDED DRAINAGE BY PERCUTANEOUS CATHETER MEDICATIONS: The patient is currently admitted to the hospital and receiving intravenous antibiotics. The antibiotics were administered within an appropriate time frame prior to the initiation of the procedure. ANESTHESIA/SEDATION: Fentanyl 5 mcg IV; Versed 100 mg IV Moderate Sedation Time:  21 The patient was continuously monitored during the procedure by the interventional radiology nurse under my direct supervision. COMPLICATIONS: None immediate. PROCEDURE: Informed written consent was obtained from the patient after a thorough discussion of the procedural risks, benefits and  alternatives.  All questions were addressed. Maximal Sterile Barrier Technique was utilized including caps, mask, sterile gowns, sterile gloves, sterile drape, hand hygiene and skin antiseptic. A timeout was performed prior to the initiation of the procedure. The left gluteal region was prepped and draped in a sterile fashion. Under CT guidance, an 18 gauge needle was inserted into the pelvic abscess via left trans gluteal approach. Ten [place] dilator followed by a 10 [place] drain were inserted. It was looped and string fixed in the fluid collection then sewn to the skin. Frank pus was aspirated FINDINGS: Imaging confirms placement of a left 10 [place] trans gluteal pelvic abscess. IMPRESSION: Successful trans gluteal 10 French left pelvic abscess drain Electronically Signed   By: Marybelle Killings M.D.   On: 05/17/2015 11:11    Medications / Allergies:  Scheduled Meds: . bisacodyl  10 mg Rectal Daily  . cefTRIAXone (ROCEPHIN)  IV  2 g Intravenous Q24H  . fluconazole  200 mg Oral Daily  . lip balm  1 application Topical BID  . megestrol  80 mg Oral BID  . metronidazole  500 mg Intravenous Q6H  . pantoprazole  40 mg Oral Daily   Continuous Infusions: . 0.9 % NaCl with KCl 20 mEq / L 50 mL/hr at 05/17/15 2042   PRN Meds:.acetaminophen, acetaminophen, alum & mag hydroxide-simeth, diphenhydrAMINE, HYDROmorphone (DILAUDID) injection, HYDROmorphone (DILAUDID) injection, magic mouthwash, menthol-cetylpyridinium, methocarbamol (ROBAXIN)  IV, metoprolol, ondansetron (ZOFRAN) IV, ondansetron, phenol, promethazine  Antibiotics: Anti-infectives    Start     Dose/Rate Route Frequency Ordered Stop   05/15/15 1030  fluconazole (DIFLUCAN) tablet 200 mg     200 mg Oral Daily 05/15/15 1025     05/14/15 1200  piperacillin-tazobactam (ZOSYN) IVPB 3.375 g  Status:  Discontinued     3.375 g 12.5 mL/hr over 240 Minutes Intravenous Every 8 hours 05/14/15 0748 05/15/15 1458   05/14/15 0745  piperacillin-tazobactam (ZOSYN)  IVPB 3.375 g  Status:  Discontinued     3.375 g 100 mL/hr over 30 Minutes Intravenous 3 times per day 05/14/15 0744 05/14/15 0747   05/14/15 0715  cefTRIAXone (ROCEPHIN) 2 g in dextrose 5 % 50 mL IVPB    Comments:  Pharmacy may adjust dosing strength / duration / interval for maximal efficacy   2 g 100 mL/hr over 30 Minutes Intravenous Every 24 hours 05/14/15 0702     05/14/15 0715  metroNIDAZOLE (FLAGYL) IVPB 500 mg     500 mg 100 mL/hr over 60 Minutes Intravenous Every 6 hours 05/14/15 0702     05/14/15 0345  piperacillin-tazobactam (ZOSYN) IVPB 3.375 g     3.375 g 100 mL/hr over 30 Minutes Intravenous  Once 05/14/15 4888 05/14/15 0546       Assessment/Plan Sigmoid Colitis  Pelvic fluid collection-s/p IR drain 3/27 -tolerating a diet, minimal tenderness, WBCnormalized, afebrile. May change to PO antibiotics and discharge when felt medically stable. Follow up with Dr. Johney Maine on 4/7 at 2:30.  Will need to be seen in the drain clinic for CT/drain check prior to that -she will need a colonoscopy in 6-8 weeks -follow cultures  Liver mass-low suspicion for malignancy on MRI. Recommended MRI in 3-6 months Uterine fibroids-felt benign on MRI   Erby Pian, ANP-BC [facility] Pager [phone](7A-4:30P)   05/18/2015 9:39 AM

## 2015-05-19 LAB — CULTURE, ROUTINE-ABSCESS: Culture: NO GROWTH

## 2015-05-21 LAB — BODY FLUID CULTURE
CULTURE: NO GROWTH
Special Requests: NORMAL

## 2015-05-25 ENCOUNTER — Ambulatory Visit
Admission: RE | Admit: 2015-05-25 | Discharge: 2015-05-25 | Disposition: A | Discharge status: Home / Self Care | Payer: No Typology Code available for payment source | Source: Ambulatory Visit | Attending: Radiology | Admitting: Radiology

## 2015-05-25 ENCOUNTER — Inpatient Hospital Stay: Payer: Medicaid Other | Admitting: Family Medicine

## 2015-05-25 ENCOUNTER — Ambulatory Visit
Admission: RE | Admit: 2015-05-25 | Discharge: 2015-05-25 | Disposition: A | Discharge status: Home / Self Care | Payer: Medicaid Other | Source: Ambulatory Visit | Attending: Surgery | Admitting: Surgery

## 2015-05-25 DIAGNOSIS — R188 Other ascites: Secondary | ICD-10-CM

## 2015-05-25 MED ORDER — IOPAMIDOL (ISOVUE-300) INJECTION 61%
100.0000 mL | Freq: Once | INTRAVENOUS | Status: AC | PRN
  Administered 2015-05-25: 100 mL via INTRAVENOUS

## 2015-05-25 NOTE — Progress Notes (Signed)
    Referring Physician(s): Allred,Darrell K  Supervising Physician: Markus Daft  Chief Complaint: Follow up pelvic abscess  Subjective: Pt is 45 yo AA female who developed pelvic abscess secondary to colitis and had to have perc drain placed about a week ago. She was discharged on po abx and is here today for follow up CT and evaluation. She has been doing well, no fevers, minimal pain except soreness at the drain site. She has been flushing the drain with 5cc NS and only getting about 5cc output per day of thin serous fluid. Review of the chart finds the cultures were negative.  Allergies: Ibuprofen  Medications: Prior to Admission medications   Medication Sig Start Date End Date Taking? Authorizing Provider  ciprofloxacin (CIPRO) 500 MG tablet Take 1 tablet (500 mg total) by mouth 2 (two) times daily. 05/18/15   Maryann Mikhail, DO  fluconazole (DIFLUCAN) 100 MG tablet Take 1 tablet (100 mg total) by mouth daily. 05/18/15   Maryann Mikhail, DO  megestrol (MEGACE) 40 MG tablet Take 2 tablets (80 mg total) by mouth 2 (two) times daily. 05/18/15   Maryann Mikhail, DO  metroNIDAZOLE (FLAGYL) 500 MG tablet Take 1 tablet (500 mg total) by mouth 3 (three) times daily. 05/18/15   Maryann Mikhail, DO  pantoprazole (PROTONIX) 40 MG tablet Take 1 tablet (40 mg total) by mouth daily. 05/18/15   Maryann Mikhail, DO     Vital Signs: LMP 05/04/2015 Temp: 97.7, HR: 65, BP: 108/62  Physical Exam Abd: soft, NT, ND (L)transgluteal drain intact, site clean. Scant serous output in bulb. Drain injection performed, no evidence of fistula. Drain removed without complication. Dressing applied.  Imaging: No results found.  Labs:  CBC:  Recent Labs  05/15/15 0406 05/16/15 0438 05/17/15 0439 05/18/15 0426  WBC 20.6* 14.7* 11.2* 9.6  HGB 9.6* 10.0* 10.0* 9.9*  HCT 29.2* 30.6* 30.3* 30.0*  PLT 397 448* 490* 466*    COAGS:  Recent Labs  05/16/15 1454  INR 1.25    BMP:  Recent  Labs  05/15/15 0406 05/16/15 0438 05/17/15 0439 05/18/15 0426  NA 140 139 142 141  K 3.8 3.5 3.4* 3.5  CL 109 108 108 108  CO2 24 23 25 24   GLUCOSE 100* 92 94 102*  BUN 6 <5* <5* 6  CALCIUM 7.7* 7.9* 8.2* 8.4*  CREATININE 0.53 0.57 0.57 0.54  GFRNONAA >60 >60 >60 >60  GFRAA >60 >60 >60 >60    LIVER FUNCTION TESTS:  Recent Labs  05/14/15 0035 05/15/15 0406 05/16/15 0438 05/17/15 0439  BILITOT 1.1 0.3 0.5 0.5  AST 16 12* 11* 11*  ALT 10* 8* 8* 8*  ALKPHOS 69 54 56 54  PROT 7.7 6.2* 6.3* 6.3*  ALBUMIN 3.4* 2.6* 2.8* 2.9*    Assessment and Plan: Pelvic abscess s/p perc drain 3/27. Reviewed CT-abscess resolved. Drain injection shows no fistula Drain removed. Pt to finish abx as directed.  Incidental finding of marked uterine fibroids, for which she is apparently symptomatic. Does not have OB/Gyn as she is without Insurance at the moment. Encouraged her to establish OB/Gyn provider and that ultimately, she may be a candidate for [place].  Electronically Signed: Ascencion Dike 05/25/2015, 9:12 AM   I spent a total of 15 Minutes at the the patient's bedside AND on the patient's hospital floor or unit, greater than 50% of which was counseling/coordinating care for pelvic abscess drain.

## 2016-05-03 ENCOUNTER — Ambulatory Visit: Payer: No Typology Code available for payment source | Admitting: Family Medicine

## 2016-05-08 ENCOUNTER — Ambulatory Visit: Payer: Self-pay | Attending: Family Medicine | Admitting: Family Medicine

## 2016-05-08 VITALS — BP 103/68 | HR 86 | Temp 98.3°F | Resp 18 | Ht 60.0 in | Wt 134.4 lb

## 2016-05-08 DIAGNOSIS — N946 Dysmenorrhea, unspecified: Secondary | ICD-10-CM | POA: Insufficient documentation

## 2016-05-08 DIAGNOSIS — N921 Excessive and frequent menstruation with irregular cycle: Secondary | ICD-10-CM

## 2016-05-08 DIAGNOSIS — Z86018 Personal history of other benign neoplasm: Secondary | ICD-10-CM

## 2016-05-08 DIAGNOSIS — D259 Leiomyoma of uterus, unspecified: Secondary | ICD-10-CM | POA: Insufficient documentation

## 2016-05-08 DIAGNOSIS — N920 Excessive and frequent menstruation with regular cycle: Secondary | ICD-10-CM | POA: Insufficient documentation

## 2016-05-08 DIAGNOSIS — Z79899 Other long term (current) drug therapy: Secondary | ICD-10-CM | POA: Insufficient documentation

## 2016-05-08 MED ORDER — ACETAMINOPHEN 500 MG PO TABS: 1000.0000 mg | ORAL_TABLET | Freq: Four times a day (QID) | ORAL | 0 refills | Status: DC | PRN

## 2016-05-08 NOTE — Progress Notes (Signed)
Patient is here for Fibroids  Patient denies pain today but she usually has pain that comes & goes  Patient stated that sometimes she has two  periods a month   Patient is not on any current meds

## 2016-05-08 NOTE — Progress Notes (Signed)
Subjective:  Patient ID: Cheryl Grant, female    DOB: 07-17-1970  Age: 46 y.o. MRN: [id number]  CC: Establish Care   HPI Cheryl Grant presents for   Uterine fibroids: Since 1994. Reports 2004 laprscoptic removed some fibroids. Symptoms include throbbing, 2 periods a month irregular history 2 years ago, pain with sexual intercourse, pressure on bladder. Heavy menstrual bleeding with regular cycles. LPM: 3/9 for 5 days. Cycle usually 5 days. Reports 2nd day heavy menstrual bleeding for two days. Use of super tampons  5 to 6 per day.   Outpatient Medications Prior to Visit  Medication Sig Dispense Refill  . ciprofloxacin (CIPRO) 500 MG tablet Take 1 tablet (500 mg total) by mouth 2 (two) times daily. (Patient not taking: Reported on 05/08/2016) 20 tablet 0  . fluconazole (DIFLUCAN) 100 MG tablet Take 1 tablet (100 mg total) by mouth daily. (Patient not taking: Reported on 05/08/2016) 10 tablet 0  . megestrol (MEGACE) 40 MG tablet Take 2 tablets (80 mg total) by mouth 2 (two) times daily. (Patient not taking: Reported on 05/08/2016) 120 tablet 0  . metroNIDAZOLE (FLAGYL) 500 MG tablet Take 1 tablet (500 mg total) by mouth 3 (three) times daily. (Patient not taking: Reported on 05/08/2016) 30 tablet 0  . pantoprazole (PROTONIX) 40 MG tablet Take 1 tablet (40 mg total) by mouth daily. (Patient not taking: Reported on 05/08/2016) 30 tablet 0   No facility-administered medications prior to visit.     ROS Review of Systems  Constitutional: Negative.   Respiratory: Negative.   Cardiovascular: Negative.   Gastrointestinal: Negative.   Genitourinary: Positive for menstrual problem and pelvic pain.  Neurological: Negative.    Objective:  BP 103/68 (BP Location: Left Arm, Patient Position: Sitting, Cuff Size: Normal)   Pulse 86   Temp 98.3 F (36.8 C) (Oral)   Resp 18   Ht 5' (1.524 m)   Wt 134 lb 6.4 oz (61 kg)   LMP 04/23/2016   SpO2 95%   BMI 26.25 kg/m   BP/Weight 05/08/2016  05/25/2015 1/44/3154  Systolic BP 008 676 195  Diastolic BP 68 62 64  Wt. (Lbs) 134.4 - -  BMI 26.25 - -   Physical Exam  Eyes: Conjunctivae are normal. Pupils are equal, round, and reactive to light.  Neck: No JVD present.  Cardiovascular: Normal rate, regular rhythm, normal heart sounds and intact distal pulses.   Pulmonary/Chest: Effort normal and breath sounds normal.  Abdominal: Soft. Bowel sounds are normal. There is tenderness (suprapubic).  Skin: Skin is warm and dry.  Nursing note and vitals reviewed.   Assessment & Plan:   Problem List Items Addressed This Visit    None    Visit Diagnoses    History of uterine fibroid    -  Primary   Relevant Orders   CBC with Differential (Completed)   Menorrhagia with irregular cycle       Relevant Orders   CBC with Differential (Completed)   Dysmenorrhea       Relevant Medications   acetaminophen (TYLENOL) 500 MG tablet   Other Relevant Orders   Ambulatory referral to Gynecology      Meds ordered this encounter  Medications  . acetaminophen (TYLENOL) 500 MG tablet    Sig: Take 2 tablets (1,000 mg total) by mouth every 6 (six) hours as needed.    Dispense:  30 tablet    Refill:  0    Order Specific Question:   Supervising Provider  AnswerTresa Garter [[id number]]    Follow-up: Return if symptoms worsen or fail to improve.   Alfonse Spruce FNP

## 2016-05-08 NOTE — Patient Instructions (Addendum)
Uterine Fibroids Uterine fibroids are tissue masses (tumors) that can develop in the womb (uterus). They are also called leiomyomas. This type of tumor is not cancerous (benign) and does not spread to other parts of the body outside of the pelvic area, which is between the hip bones. Occasionally, fibroids may develop in the fallopian tubes, in the cervix, or on the support structures (ligaments) that surround the uterus. You can have one or many fibroids. Fibroids can vary in size, weight, and where they grow in the uterus. Some can become quite large. Most fibroids do not require medical treatment. What are the causes? A fibroid can develop when a single uterine cell keeps growing (replicating). Most cells in the human body have a control mechanism that keeps them from replicating without control. What are the signs or symptoms? Symptoms may include:  Heavy bleeding during your period.  Bleeding or spotting between periods.  Pelvic pain and pressure.  Bladder problems, such as needing to urinate more often (urinary frequency) or urgently.  Inability to reproduce offspring (infertility).  Miscarriages. How is this diagnosed? Uterine fibroids are diagnosed through a physical exam. Your health care provider may feel the lumpy tumors during a pelvic exam. Ultrasonography and an MRI may be done to determine the size, location, and number of fibroids. How is this treated? Treatment may include:  Watchful waiting. This involves getting the fibroid checked by your health care provider to see if it grows or shrinks. Follow your health care provider's recommendations for how often to have this checked.  Hormone medicines. These can be taken by mouth or given through an intrauterine device (IUD).  Surgery.  Removing the fibroids (myomectomy) or the uterus (hysterectomy).  Removing blood supply to the fibroids (uterine artery embolization). If fibroids interfere with your fertility and you  want to become pregnant, your health care provider may recommend having the fibroids removed. Follow these instructions at home:  Keep all follow-up visits as directed by your health care provider. This is important.  Take over-the-counter and prescription medicines only as told by your health care provider.  If you were prescribed a hormone treatment, take the hormone medicines exactly as directed.  Ask your health care provider about taking iron pills and increasing the amount of dark green, leafy vegetables in your diet. These actions can help to boost your blood iron levels, which may be affected by heavy menstrual bleeding.  Pay close attention to your period and tell your health care provider about any changes, such as:  Increased blood flow that requires you to use more pads or tampons than usual per month.  A change in the number of days that your period lasts per month.  A change in symptoms that are associated with your period, such as abdominal cramping or back pain. Contact a health care provider if:  You have pelvic pain, back pain, or abdominal cramps that cannot be controlled with medicines.  You have an increase in bleeding between and during periods.  You soak tampons or pads in a half hour or less.  You feel lightheaded, extra tired, or weak. Get help right away if:  You faint.  You have a sudden increase in pelvic pain. This information is not intended to replace advice given to you by your health care provider. Make sure you discuss any questions you have with your health care provider. Document Released: 02/03/2000 Document Revised: 10/06/2015 Document Reviewed: 08/04/2013 Elsevier Interactive Patient Education  2017 [employer]  Dysmenorrhea means painful cramps during your period (menstrual period). You will have pain in your lower belly (abdomen). The pain is caused by the tightening (contracting) of the muscles of the womb (uterus). The  pain may be mild or very bad. With this condition, you may:  Have a headache.  Feel sick to your stomach (nauseous).  Throw up (vomit).  Have lower back pain. Follow these instructions at home: Helping pain and cramping   Put heat on your lower back or belly when you have pain or cramps. Use the heat source that your doctor tells you to use.  Place a towel between your skin and the heat.  Leave the heat on for 20-30 minutes.  Remove the heat if your skin turns bright red. This is especially important if you cannot feel pain, heat, or cold.  Do not have a heating pad on during sleep.  Do aerobic exercises. These include walking, swimming, or biking. These may help with cramps.  Massage your lower back or belly. This may help lessen pain. General instructions   Take over-the-counter and prescription medicines only as told by your doctor.  Do not drive or use heavy machinery while taking prescription pain medicine.  Avoid alcohol and caffeine during and right before your period. These can make cramps worse.  Do not use any products that have nicotine or tobacco. These include cigarettes and e-cigarettes. If you need help quitting, ask your doctor.  Keep all follow-up visits as told by your doctor. This is important. Contact a doctor if:  You have pain that gets worse.  You have pain that does not get better with medicine.  You have pain during sex.  You feel sick to your stomach or you throw up during your period, and medicine does not help. Get help right away if:  You pass out (faint). Summary  Dysmenorrhea means painful cramps during your period (menstrual period).  Put heat on your lower back or belly when you have pain or cramps.  Do exercises like walking, swimming, or biking to help with cramps.  Contact a doctor if you have pain during sex. This information is not intended to replace advice given to you by your health care provider. Make sure you discuss  any questions you have with your health care provider. Document Released: 05/04/2008 Document Revised: 02/23/2016 Document Reviewed: 02/23/2016 Elsevier Interactive Patient Education  2017 [employer].

## 2016-05-09 ENCOUNTER — Telehealth: Payer: Self-pay

## 2016-05-09 LAB — CBC WITH DIFFERENTIAL/PLATELET
Basophils Absolute: 68 cells/uL (ref 0–200)
Basophils Relative: 1 %
EOS PCT: 5 %
Eosinophils Absolute: 340 cells/uL (ref 15–500)
HCT: 41.2 % (ref 35.0–45.0)
Hemoglobin: 12.8 g/dL (ref 11.7–15.5)
LYMPHS PCT: 29 %
Lymphs Abs: 1972 cells/uL (ref 850–3900)
MCH: 28.4 pg (ref 27.0–33.0)
MCHC: 31.1 g/dL — ABNORMAL LOW (ref 32.0–36.0)
MCV: 91.6 fL (ref 80.0–100.0)
MONOS PCT: 6 %
MPV: 9.9 fL (ref 7.5–12.5)
Monocytes Absolute: 408 cells/uL (ref 200–950)
Neutro Abs: 4012 cells/uL (ref 1500–7800)
Neutrophils Relative %: 59 %
PLATELETS: 367 10*3/uL (ref 140–400)
RBC: 4.5 MIL/uL (ref 3.80–5.10)
RDW: 13.7 % (ref 11.0–15.0)
WBC: 6.8 10*3/uL (ref 3.8–10.8)

## 2016-05-09 NOTE — Telephone Encounter (Signed)
-----   Message from Alfonse Spruce, [place] sent at 05/09/2016 12:38 PM EDT ----- CBC lab was normal. No indications of anemia or infection present.

## 2016-05-09 NOTE — Telephone Encounter (Signed)
CMA call to over lab results  Patient Verify DOB  Patient was aware and understood

## 2016-06-06 ENCOUNTER — Encounter: Payer: Self-pay | Admitting: Family Medicine

## 2016-07-04 ENCOUNTER — Ambulatory Visit (INDEPENDENT_AMBULATORY_CARE_PROVIDER_SITE_OTHER): Payer: Self-pay | Admitting: Family Medicine

## 2016-07-04 ENCOUNTER — Encounter: Payer: Self-pay | Admitting: Family Medicine

## 2016-07-04 VITALS — Wt 134.2 lb

## 2016-07-04 DIAGNOSIS — D259 Leiomyoma of uterus, unspecified: Secondary | ICD-10-CM

## 2016-07-04 MED ORDER — MEGESTROL ACETATE 40 MG PO TABS: 40.0000 mg | ORAL_TABLET | Freq: Two times a day (BID) | ORAL | 3 refills | Status: DC

## 2016-07-04 NOTE — Progress Notes (Signed)
Pt declines seeing [place] today.  Pt provided with Free Pap Screening info and Mammogram scholarship faxed.

## 2016-07-04 NOTE — Patient Instructions (Signed)
Uterine Fibroids Uterine fibroids are tissue masses (tumors). They are also called leiomyomas. They can develop inside of a woman's womb (uterus). They can grow very large. Fibroids are not cancerous (benign). Most fibroids do not require medical treatment. Follow these instructions at home:  Keep all follow-up visits as told by your doctor. This is important.  Take medicines only as told by your doctor.  If you were prescribed a hormone treatment, take the hormone medicines exactly as told.  Do not take aspirin. It can cause bleeding.  Ask your doctor about taking iron pills and increasing the amount of dark green, leafy vegetables in your diet. These actions can help to boost your blood iron levels.  Pay close attention to your period. Tell your doctor about any changes, such as:  Increased blood flow. This may require you to use more pads or tampons than usual per month.  A change in the number of days that your period lasts per month.  A change in symptoms that come with your period, such as back pain or cramping in your belly area (abdomen). Contact a doctor if:  You have pain in your back or the area between your hip bones (pelvic area) that is not controlled by medicines.  You have pain in your abdomen that is not controlled with medicines.  You have an increase in bleeding between and during periods.  You soak tampons or pads in a half hour or less.  You feel lightheaded.  You feel extra tired.  You feel weak. Get help right away if:  You pass out (faint).  You have a sudden increase in pelvic pain. This information is not intended to replace advice given to you by your health care provider. Make sure you discuss any questions you have with your health care provider. Document Released: 03/10/2010 Document Revised: 10/07/2015 Document Reviewed: 08/04/2013 Elsevier Interactive Patient Education  2017 [employer].

## 2016-07-04 NOTE — Progress Notes (Signed)
   Subjective:    Patient ID: Cheryl Grant, female    DOB: May 16, 1970, 46 y.o.   MRN: [id number]  HPI Patient referred to our office for symptomatic fibroids. She has a history of fibroid uterus with irregular menses. Menses are heavier and with more cramping. MR of pevlis on 05/16/15 shows uterine volume of 847cc with multiple fibroids. This occurred when she had colitis and hospitalized. She is currently without insurance. She does notice full stomach with bulge.  I have reviewed the patients past medical, family, and social history.  I have reviewed the patient's medication list and allergies.   Review of Systems  Constitutional: Negative for chills and fever.  Respiratory: Negative for cough, shortness of breath and wheezing.   Cardiovascular: Negative for chest pain and leg swelling.       Objective:   Physical Exam  Constitutional: She is oriented to person, place, and time. She appears well-developed and well-nourished.  HENT:  Head: Normocephalic and atraumatic.  Neck: Normal range of motion. Neck supple. No thyromegaly present.  Cardiovascular: Normal rate, regular rhythm and normal heart sounds.   Pulmonary/Chest: Effort normal and breath sounds normal. No respiratory distress. She has no wheezes. She has no rales.  Abdominal: Soft. She exhibits no distension. There is no tenderness. There is no rebound and no guarding. Hernia confirmed negative in the right inguinal area and confirmed negative in the left inguinal area.  Genitourinary: There is no rash, tenderness, lesion or injury on the right labia. There is no rash, tenderness, lesion or injury on the left labia. Uterus is enlarged (22 week firm and nodular uterus). Cervix exhibits no motion tenderness and no discharge. Right adnexum displays no mass, no tenderness and no fullness. Left adnexum displays no mass, no tenderness and no fullness. No erythema, tenderness or bleeding in the vagina. No foreign body in the vagina. No  signs of injury around the vagina. No vaginal discharge found.  Lymphadenopathy:       Right: No inguinal adenopathy present.       Left: No inguinal adenopathy present.  Neurological: She is alert and oriented to person, place, and time.  Skin: Skin is warm and dry.  Psychiatric: She has a normal mood and affect. Her behavior is normal. Judgment and thought content normal.      Assessment & Plan:  1. Uterine leiomyoma, unspecified location Discussed options with patient - medical vs surgical. Pt would like hysterectomy, but needs to sign up for financial assistance prior to surgery. Will start with megace 40mg  BID. Pt to return after financial assistance filled out and approved.

## 2016-07-24 ENCOUNTER — Emergency Department (HOSPITAL_COMMUNITY): Payer: Self-pay

## 2016-07-24 ENCOUNTER — Encounter (HOSPITAL_COMMUNITY): Payer: Self-pay

## 2016-07-24 ENCOUNTER — Emergency Department (HOSPITAL_COMMUNITY)
Admission: EM | Admit: 2016-07-24 | Discharge: 2016-07-24 | Disposition: A | Discharge status: Home / Self Care | Payer: Self-pay | Attending: Emergency Medicine | Admitting: Emergency Medicine

## 2016-07-24 DIAGNOSIS — Y929 Unspecified place or not applicable: Secondary | ICD-10-CM | POA: Insufficient documentation

## 2016-07-24 DIAGNOSIS — X509XXA Other and unspecified overexertion or strenuous movements or postures, initial encounter: Secondary | ICD-10-CM | POA: Insufficient documentation

## 2016-07-24 DIAGNOSIS — Z79899 Other long term (current) drug therapy: Secondary | ICD-10-CM | POA: Insufficient documentation

## 2016-07-24 DIAGNOSIS — M25562 Pain in left knee: Secondary | ICD-10-CM | POA: Insufficient documentation

## 2016-07-24 DIAGNOSIS — Y939 Activity, unspecified: Secondary | ICD-10-CM | POA: Insufficient documentation

## 2016-07-24 DIAGNOSIS — Y999 Unspecified external cause status: Secondary | ICD-10-CM | POA: Insufficient documentation

## 2016-07-24 HISTORY — DX: Noninfective gastroenteritis and colitis, unspecified: K52.9

## 2016-07-24 MED ORDER — TRAMADOL HCL 50 MG PO TABS: 50.0000 mg | ORAL_TABLET | Freq: Four times a day (QID) | ORAL | 0 refills | Status: DC | PRN

## 2016-07-24 NOTE — ED Triage Notes (Signed)
Patient states she was squatted trying to get something under the sofa and felt her left knee pop yesterday. Patient c/o increased pain and swelling today. Pain worse with weight bearing.

## 2016-07-24 NOTE — Discharge Instructions (Signed)
It was my pleasure taking care of you today!   Tylenol as needed for pain. Knee sleeve whenever you are moving around. Use crutches as needed for comfort. Ice and elevate knee throughout the day.  Follow up with the orthopedist listed if symptoms do not improve in one week.   Return to the ER for new or worsening symptoms, any additional concerns.   COLD THERAPY DIRECTIONS:  Ice or gel packs can be used to reduce both pain and swelling. Ice is the most helpful within the first 24 to 48 hours after an injury or flareup from overusing a muscle or joint.  Ice is effective, has very few side effects, and is safe for most people to use.   If you expose your skin to cold temperatures for too long or without the proper protection, you can damage your skin or nerves. Watch for signs of skin damage due to cold.   HOME CARE INSTRUCTIONS  Follow these tips to use ice and cold packs safely.  Place a dry or damp towel between the ice and skin. A damp towel will cool the skin more quickly, so you may need to shorten the time that the ice is used.  For a more rapid response, add gentle compression to the ice.  Ice for no more than 10 to 20 minutes at a time. The bonier the area you are icing, the less time it will take to get the benefits of ice.  Check your skin after 5 minutes to make sure there are no signs of a poor response to cold or skin damage.  Rest 20 minutes or more in between uses.  Once your skin is numb, you can end your treatment. You can test numbness by very lightly touching your skin. The touch should be so light that you do not see the skin dimple from the pressure of your fingertip. When using ice, most people will feel these normal sensations in this order: cold, burning, aching, and numbness.

## 2016-07-24 NOTE — ED Notes (Signed)
PT DISCHARGED. INSTRUCTIONS AND PRESCRIPTION GIVEN. AAOX4. PT IN NO APPARENT DISTRESS WITH MILD PAIN. THE OPPORTUNITY TO ASK QUESTIONS WAS PROVIDED. 

## 2016-07-24 NOTE — ED Provider Notes (Signed)
[facility] DEPT Provider Note   CSN: [id number] Arrival date & time: 07/24/16  1818     History   Chief Complaint Chief Complaint  Patient presents with  . Knee Pain    HPI Cheryl Grant is a 46 y.o. female.  The history is provided by the patient and medical records. No language interpreter was used.  Knee Pain   Pertinent negatives include no numbness.   Cheryl Grant is a 46 y.o. female  who presents to the Emergency Department complaining of acute onset of left knee pain Which began yesterday. Patient states that a dog toy went under her couch, therefore she squatted down to try to get it out when she felt her knee pop. It was painful, but did not immediately swell and she was able to ambulate afterwards with minimal discomfort. She awoke today and pain was much worse. The knee felt stiff and as she walked on in throughout the day, it became more and more painful. She now feels as if it is swollen. No ankle or hip pain. No numbness/tingling. No open wounds. She has been taking extra-strength tylenol with mild improvement. Pain worse with ambulation and movement. Better with rest. Does not have an orthopedist. No prior injuries to knees in the past.   Past Medical History:  Diagnosis Date  . Colitis   . Uterine fibroid     Patient Active Problem List   Diagnosis Date Noted  . Protein-calorie malnutrition ([place]) 05/15/2015  . Colitis 05/14/2015  . Liver masses 05/14/2015  . Pelvic fluid collection 05/14/2015  . Fibroid uterus 05/14/2015  . Hypokalemia 05/14/2015  . Constipation, chronic 05/14/2015    Past Surgical History:  Procedure Laterality Date  . CESAREAN SECTION  2003   Primary cesarean section, Kroneg incision  . ROBOT ASSISTED MYOMECTOMY  2007   Dr Dellis Filbert    OB History    No data available       Home Medications    Prior to Admission medications   Medication Sig Start Date End Date Taking? Authorizing Provider  acetaminophen (TYLENOL) 500 MG  tablet Take 2 tablets (1,000 mg total) by mouth every 6 (six) hours as needed. 05/08/16   Alfonse Spruce, FNP  megestrol (MEGACE) 40 MG tablet Take 1 tablet (40 mg total) by mouth 2 (two) times daily. 07/04/16   Truett Mainland, DO  traMADol (ULTRAM) 50 MG tablet Take 1 tablet (50 mg total) by mouth every 6 (six) hours as needed. 07/24/16   Ward, Ozella Almond, PA-C    Family History No family history on file.  Social History Social History  Substance Use Topics  . Smoking status: Never Smoker  . Smokeless tobacco: Never Used  . Alcohol use Yes     Comment: ocassional     Allergies   Ibuprofen   Review of Systems Review of Systems  Musculoskeletal: Positive for arthralgias.  Neurological: Negative for weakness and numbness.     Physical Exam Updated Vital Signs BP 126/74 (BP Location: Right Arm)   Pulse 69   Temp 99.9 F (37.7 C) (Oral)   Resp 16   Ht 5' (1.524 m)   Wt 61.2 kg (135 lb)   LMP 06/19/2016 (Approximate)   SpO2 100%   BMI 26.37 kg/m   Physical Exam  Constitutional: She appears well-developed and well-nourished. No distress.  HENT:  Head: Normocephalic and atraumatic.  Neck: Neck supple.  Cardiovascular: Normal rate, regular rhythm and normal heart sounds.   No  murmur heard. Pulmonary/Chest: Effort normal and breath sounds normal. No respiratory distress. She has no wheezes. She has no rales.  Musculoskeletal:  Left knee with tenderness to palpation of lateral knee and patellar tendon. Decreased ROM 2/2 pain. Mild swelling appreciated. No abnormal alignment or patellar mobility. No bruising, erythema or warmth overlaying the joint. No varus/valgus laxity. Negative drawer's, Lachman's and McMurray's.  No crepitus. 2+ DP pulses bilaterally. All compartments are soft. Sensation intact distal to injury.  Neurological: She is alert.  Skin: Skin is warm and dry.  Nursing note and vitals reviewed.    ED Treatments / Results  Labs (all labs ordered  are listed, but only abnormal results are displayed) Labs Reviewed - No data to display  EKG  EKG Interpretation None       Radiology Dg Knee Complete 4 Views Left  Result Date: 07/24/2016 CLINICAL DATA:  Left knee pain after twisting injury EXAM: LEFT KNEE - COMPLETE 4+ VIEW COMPARISON:  None. FINDINGS: No evidence of fracture, dislocation, or joint effusion. No evidence of arthropathy or other focal bone abnormality. Trace suprapatellar effusion. IMPRESSION: Trace effusion.  No acute osseous abnormality. Electronically Signed   By: Donavan Foil M.D.   On: 07/24/2016 20:04    Procedures Procedures (including critical care time)  Medications Ordered in ED Medications - No data to display   Initial Impression / Assessment and Plan / ED Course  I have reviewed the triage vital signs and the nursing notes.  Pertinent labs & imaging results that were available during my care of the patient were reviewed by me and considered in my medical decision making (see chart for details).    Cheryl Grant is a 46 y.o. female who presents to ED for left knee pain since yesterday. On exam, patient with tenderness to palpation of lateral left knee and patellar tendon with decreased ROM.  Patient is afebrile and without erythema/warmth of the affected area. X-ray without acute finding. LLE NVI. Able to ambulate, however it is uncomfortable. Crutches provided in ED. She has knee sleeve at home which I encouraged her to use. Patient advised to follow up with  orthopedics if symptoms do not improve in 1 week. Return precaurtions discussed. All questions answered.  Final Clinical Impressions(s) / ED Diagnoses   Final diagnoses:  Acute pain of left knee    New Prescriptions Discharge Medication List as of 07/24/2016  8:43 PM       Ward, Ozella Almond, PA-C 07/24/16 2138    Lacretia Leigh, MD 07/27/16 [phone]

## 2016-07-25 ENCOUNTER — Other Ambulatory Visit: Payer: Self-pay | Admitting: Family Medicine

## 2016-07-25 DIAGNOSIS — Z1231 Encounter for screening mammogram for malignant neoplasm of breast: Secondary | ICD-10-CM

## 2016-09-22 ENCOUNTER — Inpatient Hospital Stay (HOSPITAL_COMMUNITY)
Admission: AD | Admit: 2016-09-22 | Discharge: 2016-09-23 | Disposition: A | Discharge status: Home / Self Care | Payer: Self-pay | Source: Ambulatory Visit | Attending: Obstetrics and Gynecology | Admitting: Obstetrics and Gynecology

## 2016-09-22 DIAGNOSIS — D259 Leiomyoma of uterus, unspecified: Secondary | ICD-10-CM | POA: Insufficient documentation

## 2016-09-22 DIAGNOSIS — N939 Abnormal uterine and vaginal bleeding, unspecified: Secondary | ICD-10-CM | POA: Insufficient documentation

## 2016-09-22 NOTE — MAU Note (Signed)
Have hx fibroids. Talking to docs about hysterectomy. Started Megace in May and helped in May and June. By end of June started spotting despite taking medicine. Stopped pills in July and had period. Started back on pills but continue to have spotting. Now spotting is dark brown. No pain tonight

## 2016-09-23 DIAGNOSIS — D259 Leiomyoma of uterus, unspecified: Secondary | ICD-10-CM

## 2016-09-23 LAB — CBC
HCT: 37.4 % (ref 36.0–46.0)
Hemoglobin: 12.1 g/dL (ref 12.0–15.0)
MCH: 28 pg (ref 26.0–34.0)
MCHC: 32.4 g/dL (ref 30.0–36.0)
MCV: 86.6 fL (ref 78.0–100.0)
PLATELETS: 449 10*3/uL — AB (ref 150–400)
RBC: 4.32 MIL/uL (ref 3.87–5.11)
RDW: 14.9 % (ref 11.5–15.5)
WBC: 10.5 10*3/uL (ref 4.0–10.5)

## 2016-09-23 LAB — POCT PREGNANCY, URINE: Preg Test, Ur: NEGATIVE

## 2016-09-23 MED ORDER — MEGESTROL ACETATE 40 MG PO TABS: 80.0000 mg | ORAL_TABLET | Freq: Three times a day (TID) | ORAL | 1 refills | Status: DC

## 2016-09-23 NOTE — Progress Notes (Signed)
ERin Lawrence Np in earlier to discuss test results and d/c plan. Written and verbal d/c instructions given and understanding voiced

## 2016-09-23 NOTE — MAU Provider Note (Signed)
History     CSN: [id number]  Arrival date and time: 09/22/16 2327  First Provider Initiated Contact with Patient 09/23/16 0008      Chief Complaint  Patient presents with  . Vaginal Bleeding   HPI Cheryl Grant is a 46 y.o. female who presents with vaginal bleeding. Patient has known uterine fibroids with planned hysterectomy once her financial aid paperwork is completed. Was prescribed megace in May by Dr. Nehemiah Settle. Reports she stopped taking it the end of June so she could have a period. Reports having a normal menses the middle of July but reports continued spotting daily since then. Vaginal spotting is brown/black. Not saturating pads & no clots. Denies abdominal pain. Is currently taking megace 80 mg BID (states she changed the dose to reflect how she was taking it last year).   Past Medical History:  Diagnosis Date  . Colitis   . Uterine fibroid     Past Surgical History:  Procedure Laterality Date  . CESAREAN SECTION  2003   Primary cesarean section, Kroneg incision  . ROBOT ASSISTED MYOMECTOMY  2007   Dr Dellis Filbert    No family history on file.  Social History  Substance Use Topics  . Smoking status: Never Smoker  . Smokeless tobacco: Never Used  . Alcohol use Yes     Comment: ocassional    Allergies:  Allergies  Allergen Reactions  . Ibuprofen Hives    Prescriptions Prior to Admission  Medication Sig Dispense Refill Last Dose  . acetaminophen (TYLENOL) 500 MG tablet Take 2 tablets (1,000 mg total) by mouth every 6 (six) hours as needed. 30 tablet 0 Past Month at Unknown time  . megestrol (MEGACE) 40 MG tablet Take 1 tablet (40 mg total) by mouth 2 (two) times daily. 60 tablet 3 09/22/2016 at Unknown time  . traMADol (ULTRAM) 50 MG tablet Take 1 tablet (50 mg total) by mouth every 6 (six) hours as needed. 10 tablet 0     Review of Systems  Constitutional: Negative.   Gastrointestinal: Negative.  Negative for abdominal pain.  Genitourinary: Positive for vaginal  bleeding. Negative for dyspareunia and vaginal discharge.   Physical Exam   Blood pressure 131/71, pulse 90, temperature 98.5 F (36.9 C), resp. rate 18, height 5' (1.524 m), weight 138 lb (62.6 kg), last menstrual period 08/31/2016, SpO2 100 %.  Physical Exam  Nursing note and vitals reviewed. Constitutional: She is oriented to person, place, and time. She appears well-developed and well-nourished. No distress.  HENT:  Head: Normocephalic and atraumatic.  Eyes: Conjunctivae are normal. Right eye exhibits no discharge. Left eye exhibits no discharge. No scleral icterus.  Neck: Normal range of motion.  Respiratory: Effort normal. No respiratory distress.  Neurological: She is alert and oriented to person, place, and time.  Skin: Skin is warm and dry. She is not diaphoretic.  Psychiatric: She has a normal mood and affect. Her behavior is normal. Judgment and thought content normal.    MAU Course  Procedures Results for orders placed or performed during the hospital encounter of 09/22/16 (from the past 24 hour(s))  Pregnancy, urine POC     Status: None   Collection Time: 09/23/16 12:06 AM  Result Value Ref Range   Preg Test, Ur NEGATIVE NEGATIVE  CBC     Status: Abnormal   Collection Time: 09/23/16 12:25 AM  Result Value Ref Range   WBC 10.5 4.0 - 10.5 K/uL   RBC 4.32 3.87 - 5.11 MIL/uL   Hemoglobin  12.1 12.0 - 15.0 g/dL   HCT 37.4 36.0 - 46.0 %   MCV 86.6 78.0 - 100.0 fL   MCH 28.0 26.0 - 34.0 pg   MCHC 32.4 30.0 - 36.0 g/dL   RDW 14.9 11.5 - 15.5 %   Platelets 449 (H) 150 - 400 K/uL    MDM UPT negative CBC -- hemoglobin stable Assessment and Plan  A:  1. Uterine leiomyoma, unspecified location   2. Abnormal uterine bleeding (AUB)    P: Discharge home Increase megace States she will complete financial aid paperwork later this week & will schedule f/u with Dr. Nehemiah Settle to schedule surgery Discussed reasons to return to [place] 09/23/2016, 12:08 AM

## 2016-09-23 NOTE — MAU Note (Signed)
Urine sent to lab 

## 2016-09-23 NOTE — Discharge Instructions (Signed)
Uterine Fibroids Uterine fibroids are tissue masses (tumors) that can develop in the womb (uterus). They are also called leiomyomas. This type of tumor is not cancerous (benign) and does not spread to other parts of the body outside of the pelvic area, which is between the hip bones. Occasionally, fibroids may develop in the fallopian tubes, in the cervix, or on the support structures (ligaments) that surround the uterus. You can have one or many fibroids. Fibroids can vary in size, weight, and where they grow in the uterus. Some can become quite large. Most fibroids do not require medical treatment. What are the causes? A fibroid can develop when a single uterine cell keeps growing (replicating). Most cells in the human body have a control mechanism that keeps them from replicating without control. What are the signs or symptoms? Symptoms may include:  Heavy bleeding during your period.  Bleeding or spotting between periods.  Pelvic pain and pressure.  Bladder problems, such as needing to urinate more often (urinary frequency) or urgently.  Inability to reproduce offspring (infertility).  Miscarriages.  How is this diagnosed? Uterine fibroids are diagnosed through a physical exam. Your health care provider may feel the lumpy tumors during a pelvic exam. Ultrasonography and an MRI may be done to determine the size, location, and number of fibroids. How is this treated? Treatment may include:  Watchful waiting. This involves getting the fibroid checked by your health care provider to see if it grows or shrinks. Follow your health care provider's recommendations for how often to have this checked.  Hormone medicines. These can be taken by mouth or given through an intrauterine device (IUD).  Surgery. ? Removing the fibroids (myomectomy) or the uterus (hysterectomy). ? Removing blood supply to the fibroids (uterine artery embolization).  If fibroids interfere with your fertility and you  want to become pregnant, your health care provider may recommend having the fibroids removed. Follow these instructions at home:  Keep all follow-up visits as directed by your health care provider. This is important.  Take over-the-counter and prescription medicines only as told by your health care provider. ? If you were prescribed a hormone treatment, take the hormone medicines exactly as directed.  Ask your health care provider about taking iron pills and increasing the amount of dark green, leafy vegetables in your diet. These actions can help to boost your blood iron levels, which may be affected by heavy menstrual bleeding.  Pay close attention to your period and tell your health care provider about any changes, such as: ? Increased blood flow that requires you to use more pads or tampons than usual per month. ? A change in the number of days that your period lasts per month. ? A change in symptoms that are associated with your period, such as abdominal cramping or back pain. Contact a health care provider if:  You have pelvic pain, back pain, or abdominal cramps that cannot be controlled with medicines.  You have an increase in bleeding between and during periods.  You soak tampons or pads in a half hour or less.  You feel lightheaded, extra tired, or weak. Get help right away if:  You faint.  You have a sudden increase in pelvic pain. This information is not intended to replace advice given to you by your health care provider. Make sure you discuss any questions you have with your health care provider. Document Released: 02/03/2000 Document Revised: 10/06/2015 Document Reviewed: 08/04/2013 Elsevier Interactive Patient Education  2018 [employer] Inc[employer]  

## 2016-11-09 ENCOUNTER — Encounter: Payer: Self-pay | Admitting: Family Medicine

## 2016-11-09 ENCOUNTER — Ambulatory Visit (INDEPENDENT_AMBULATORY_CARE_PROVIDER_SITE_OTHER): Payer: Self-pay | Admitting: Family Medicine

## 2016-11-09 VITALS — BP 125/68 | HR 85 | Ht 60.0 in | Wt 144.2 lb

## 2016-11-09 DIAGNOSIS — N939 Abnormal uterine and vaginal bleeding, unspecified: Secondary | ICD-10-CM

## 2016-11-09 DIAGNOSIS — D259 Leiomyoma of uterus, unspecified: Secondary | ICD-10-CM

## 2016-11-09 DIAGNOSIS — Z1151 Encounter for screening for human papillomavirus (HPV): Secondary | ICD-10-CM

## 2016-11-09 DIAGNOSIS — Z124 Encounter for screening for malignant neoplasm of cervix: Secondary | ICD-10-CM

## 2016-11-09 LAB — POCT PREGNANCY, URINE: PREG TEST UR: NEGATIVE

## 2016-11-09 NOTE — Progress Notes (Signed)
   Subjective:    Patient ID: Cheryl Grant, female    DOB: 19-Jan-1971, 46 y.o.   MRN: [id number]  HPI Started having spotting at end of June. Stopped megace to have a period, then restarted megace after a week off from it (middle of July). Was on megace 40mg  BID, but increased it to 80mg  BID. Went to [facility] - was told to take 80mg  TID. Occasionally spotting now. Patient has had approximately 10 pound weight gain since last visit. She does have financial aid through the hospital and would like to have a hysterectomy. Does not remember when her last Pap smear is.   Review of Systems     Objective:   Physical Exam  Constitutional: She appears well-developed and well-nourished.  HENT:  Head: Normocephalic and atraumatic.  Eyes: Pupils are equal, round, and reactive to light. Conjunctivae are normal.  Pulmonary/Chest: Effort normal.  Abdominal: Soft. Bowel sounds are normal. There is no tenderness. There is no rebound and no guarding.  Genitourinary: There is no rash, tenderness or lesion on the right labia. There is no rash, tenderness or lesion on the left labia. Uterus is enlarged. Cervix exhibits no motion tenderness, no discharge and no friability. No erythema, tenderness or bleeding in the vagina. No foreign body in the vagina. No signs of injury around the vagina. No vaginal discharge found.  Skin: Skin is warm and dry.  Psychiatric: She has a normal mood and affect. Her behavior is normal. Judgment and thought content normal.   ENDOMETRIAL BIOPSY     The indications for endometrial biopsy were reviewed.   Risks of the biopsy including cramping, bleeding, infection, uterine perforation, inadequate specimen and need for additional procedures  were discussed. The patient states she understands and agrees to undergo procedure today. Consent was signed. Time out was performed. Urine HCG was negative. A sterile speculum was placed in the patient's vagina and the cervix was prepped with Betadine.  A single-toothed tenaculum was placed on the anterior lip of the cervix to stabilize it. The 3 mm pipelle was introduced into the endometrial cavity without difficulty to a depth of 15 cm, and a moderate amount of tissue was obtained and sent to pathology. The instruments were removed from the patient's vagina. Minimal bleeding from the cervix was noted. The patient tolerated the procedure well. Routine post-procedure instructions were given to the patient. The patient will follow up to review the results and for further management.        Assessment & Plan:  1. Uterine leiomyoma, unspecified location Pap smear done.  2. Abnormal uterine bleeding (AUB) Continue Megace. Endometrial biopsy done. Follow-up with Cheryl Grant for path results and for surgical consultation

## 2016-11-12 ENCOUNTER — Other Ambulatory Visit: Payer: Self-pay

## 2016-11-12 ENCOUNTER — Other Ambulatory Visit (HOSPITAL_COMMUNITY)
Admission: RE | Admit: 2016-11-12 | Discharge: 2016-11-12 | Disposition: A | Discharge status: Home / Self Care | Payer: Self-pay | Source: Ambulatory Visit | Attending: Family Medicine | Admitting: Family Medicine

## 2016-11-12 DIAGNOSIS — D259 Leiomyoma of uterus, unspecified: Secondary | ICD-10-CM | POA: Insufficient documentation

## 2016-11-13 LAB — CYTOLOGY - PAP
ADEQUACY: ABSENT
DIAGNOSIS: NEGATIVE
HPV: NOT DETECTED

## 2016-11-16 ENCOUNTER — Encounter: Payer: Self-pay | Admitting: Family Medicine

## 2016-11-16 MED ORDER — METRONIDAZOLE 500 MG PO TABS: 500.0000 mg | ORAL_TABLET | Freq: Two times a day (BID) | ORAL | 0 refills | Status: DC

## 2016-11-27 ENCOUNTER — Other Ambulatory Visit: Payer: Self-pay | Admitting: Student

## 2016-11-27 ENCOUNTER — Telehealth: Payer: Self-pay | Admitting: Obstetrics & Gynecology

## 2016-11-27 NOTE — Telephone Encounter (Signed)
Refill on her medication want to speak to a nurse

## 2016-11-27 NOTE — Telephone Encounter (Signed)
Informed pt that refill has been sent

## 2016-12-03 ENCOUNTER — Encounter (HOSPITAL_COMMUNITY): Payer: Self-pay

## 2016-12-03 ENCOUNTER — Ambulatory Visit (INDEPENDENT_AMBULATORY_CARE_PROVIDER_SITE_OTHER): Payer: Self-pay | Admitting: Obstetrics & Gynecology

## 2016-12-03 ENCOUNTER — Encounter: Payer: Self-pay | Admitting: Obstetrics & Gynecology

## 2016-12-03 VITALS — BP 128/80 | HR 80 | Wt 147.0 lb

## 2016-12-03 DIAGNOSIS — Z0000 Encounter for general adult medical examination without abnormal findings: Secondary | ICD-10-CM

## 2016-12-03 DIAGNOSIS — D219 Benign neoplasm of connective and other soft tissue, unspecified: Secondary | ICD-10-CM

## 2016-12-03 NOTE — Progress Notes (Signed)
   Subjective:    Patient ID: Cheryl Grant, female    DOB: 1970-03-07, 46 y.o.   MRN: [id number]  HPI 46 yo engaged AA P84 (76 yo son) here today to discuss a possible hysterectomy. She has known about her fibroids "forever" but since about 4/18 she has had irregular bleeding. I don't see any u/s since 2011. Her EMBX was normal recently. She was started on megace here in May, takes it daily. She spotting anyway. She has gained 15 #. She has a h/o dysparueunia, "numbness" sometimes with sex.  Review of Systems Pap normal recently Baby born via c/s She has a robotic myomectomy by Dr. Dellis Filbert in 2004. CNA, home health care    Objective:   Physical Exam Breathing, conversing, and ambulating normally Abd- benign Uterus palpable to umbilicus     Assessment & Plan:  Preventative care- declines flu vaccine, schedule mammogram Weight gain- check TSH Fibroids by history- Schedule for TAH/BS

## 2016-12-03 NOTE — Progress Notes (Addendum)
[place] scheduled for October 23rd @ 0900.  Pt notified.  Dr. Hulan Fray decided to d/c order.  Pt notified.

## 2016-12-11 ENCOUNTER — Ambulatory Visit (HOSPITAL_COMMUNITY): Payer: Self-pay

## 2016-12-18 ENCOUNTER — Other Ambulatory Visit (HOSPITAL_COMMUNITY): Payer: Self-pay | Admitting: Obstetrics & Gynecology

## 2016-12-18 ENCOUNTER — Other Ambulatory Visit: Payer: Self-pay | Admitting: Family Medicine

## 2016-12-18 DIAGNOSIS — Z1231 Encounter for screening mammogram for malignant neoplasm of breast: Secondary | ICD-10-CM

## 2016-12-22 IMAGING — CT CT ABD-PELV W/ CM
3 of 4 series · 12 of 36 positions shown, 18 images · IV contrast ([ID] ISOVUE 300)
Comparison: 05/14/2015

CLINICAL DATA: 45-year-old with a pelvic abscess drain. Follow-up
pelvic fluid collection and drainage catheter. Patient reports
minimal output from the pelvic drain.

EXAM:
CT ABDOMEN AND PELVIS WITH CONTRAST
TECHNIQUE: Multidetector CT imaging of the abdomen and pelvis was performed
using the standard protocol following bolus administration of
intravenous contrast.
CONTRAST:  100mL F888PF-D00 IOPAMIDOL (F888PF-D00) INJECTION 61%

[Series 3: abd/pelvis with · axial · 0.72mm/px · z∈[-340,-35]mm · 8 of 79 slices shown, 13 images]
[im 9/79  soft-tissue]
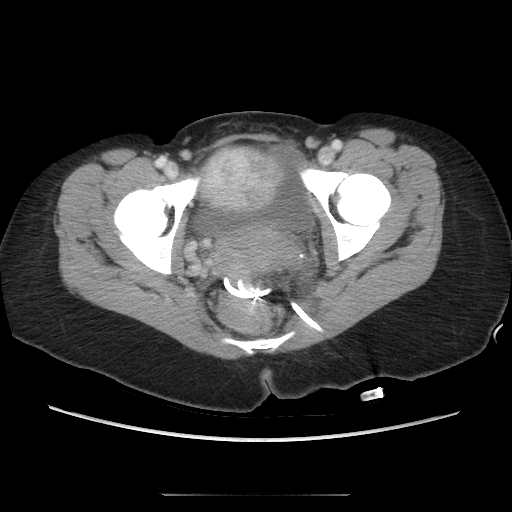
[im 9/79  bone]
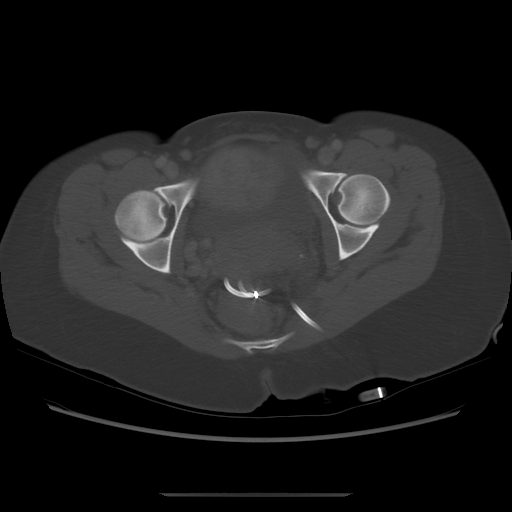
[im 18/79  soft-tissue]
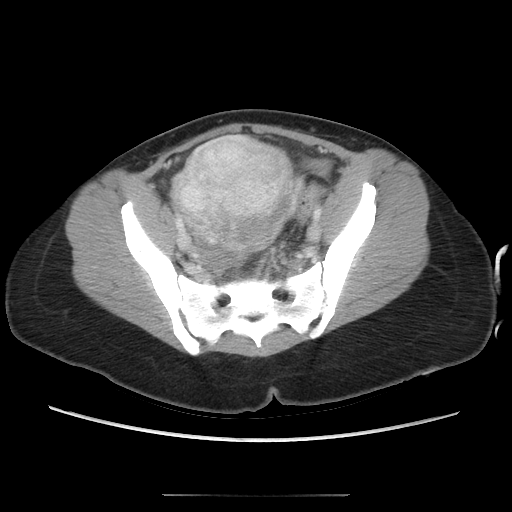
[im 27/79  soft-tissue]
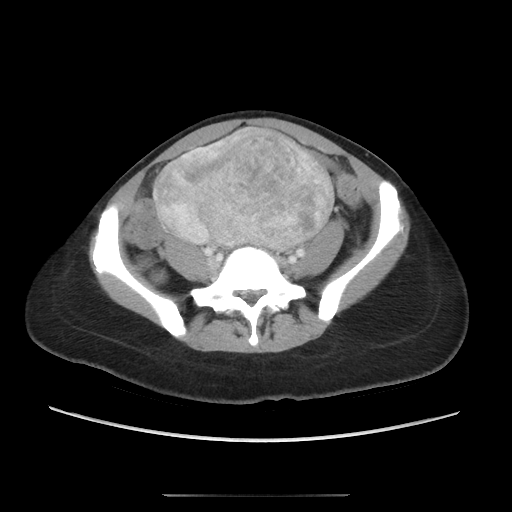
[im 35/79  soft-tissue]
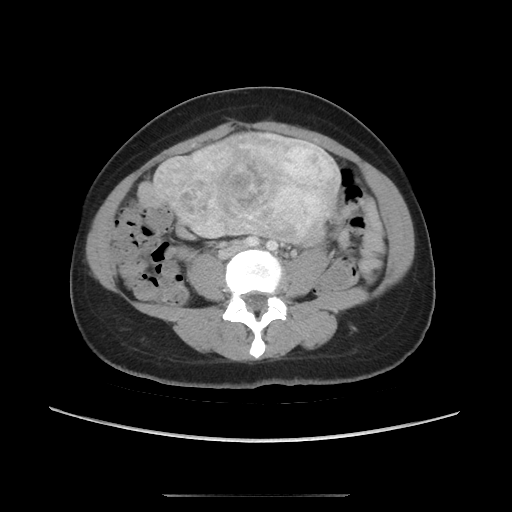
[im 44/79  soft-tissue]
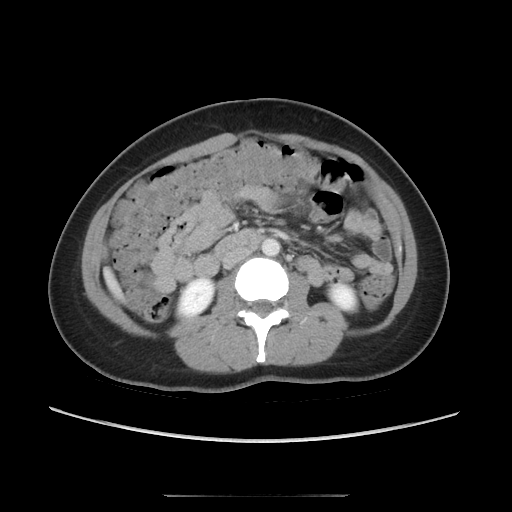
[im 44/79  lung]
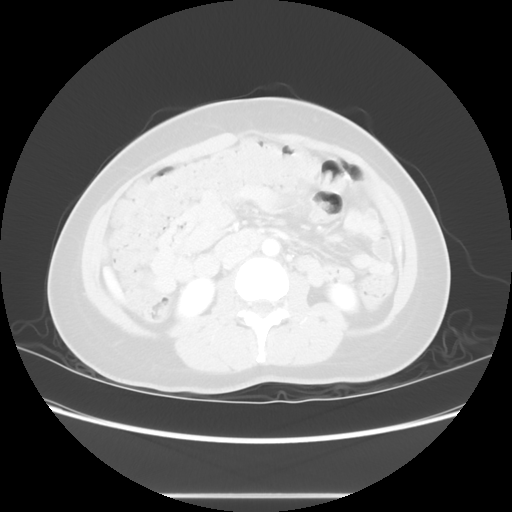
[im 53/79  soft-tissue]
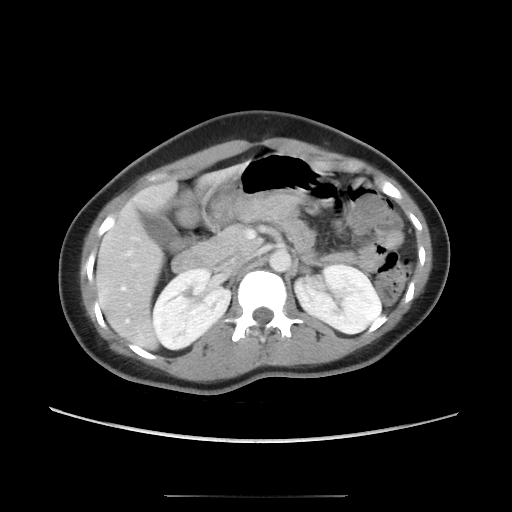
[im 53/79  lung]
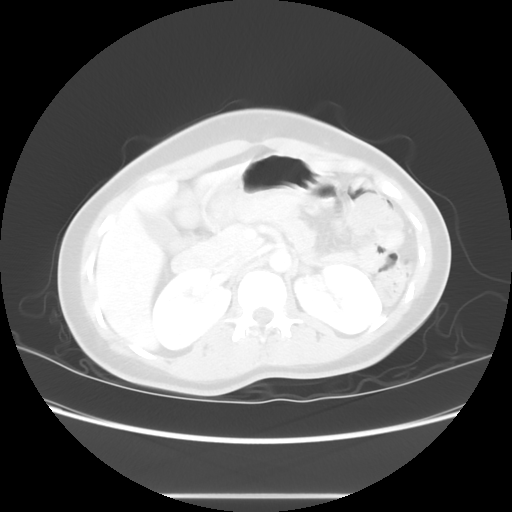
[im 61/79  soft-tissue]
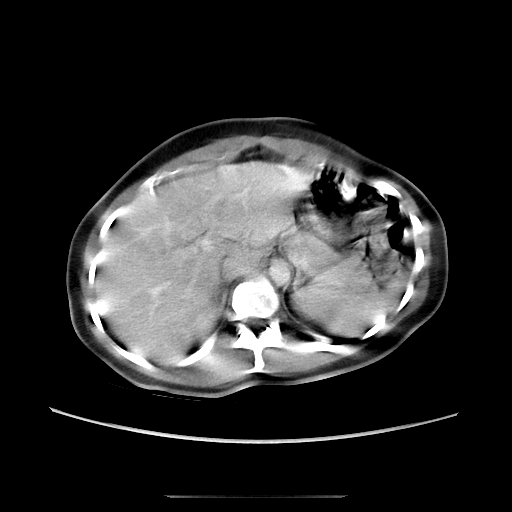
[im 61/79  lung]
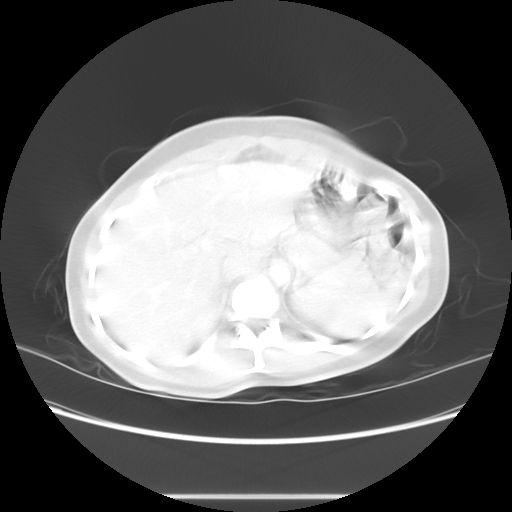
[im 70/79  soft-tissue]
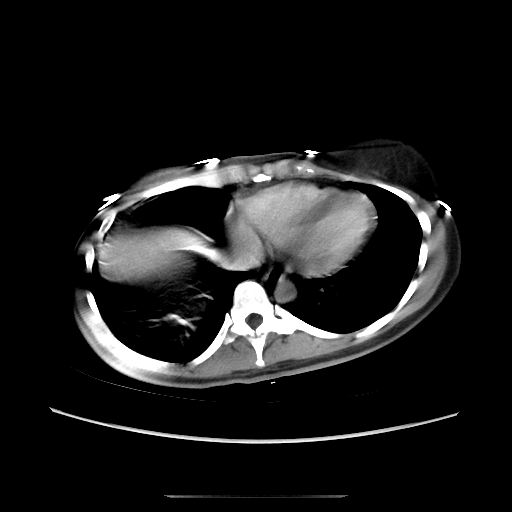
[im 70/79  lung]
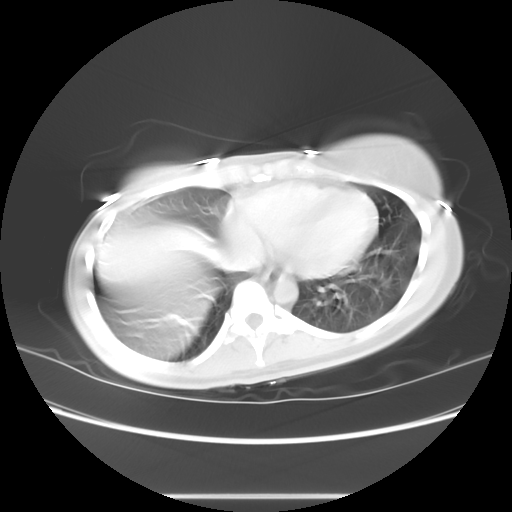

[Series 601: coronal body · coronal · 0.85mm/px · 1 of 107 slices shown, 2 images]
[im 36/107  soft-tissue]
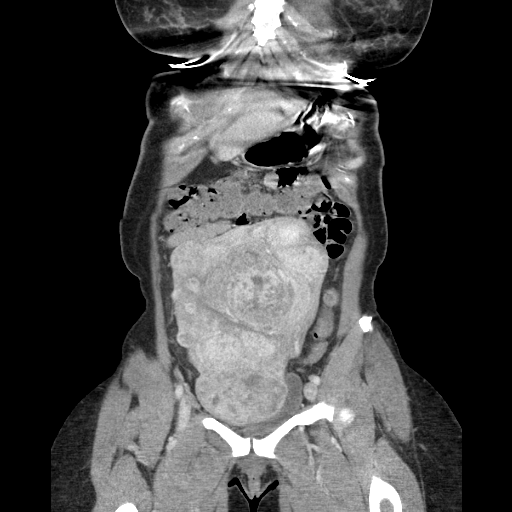
[im 36/107  bone]
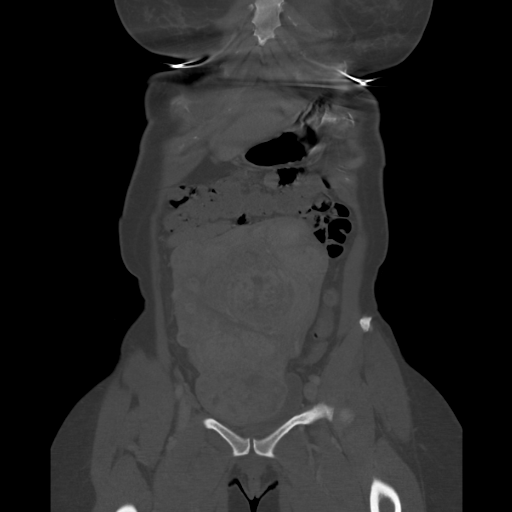

[Series 602: sagittal body · sagittal · 0.85mm/px · 3 of 149 slices shown]
[im 17/149  soft-tissue]
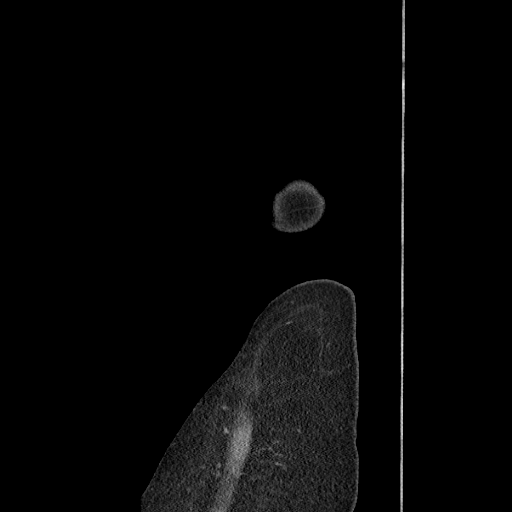
[im 33/149  soft-tissue]
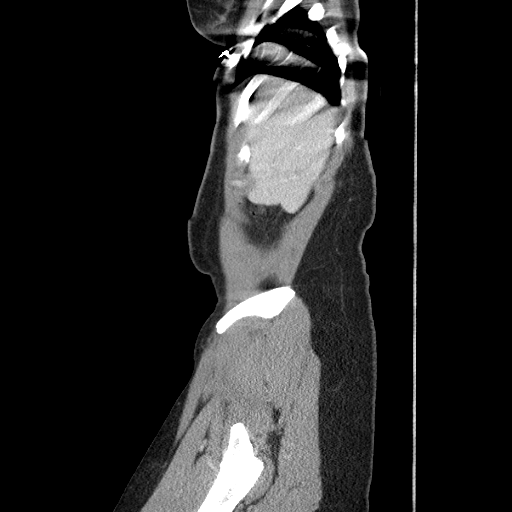
[im 50/149  soft-tissue]
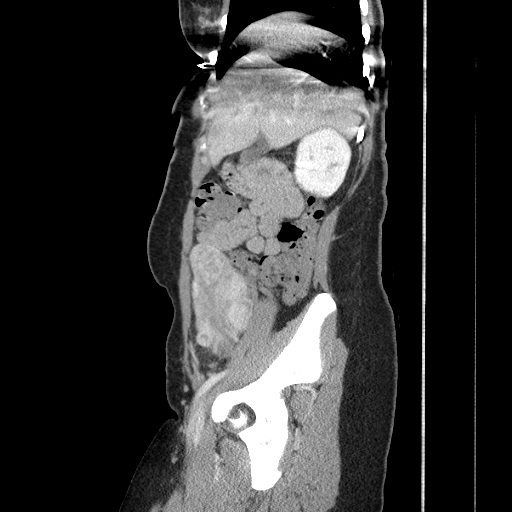

[12 of 36 positions shown; findings below may reference images not displayed]

FINDINGS: Lower chest: Limited evaluation of lung bases due to excessive
motion artifact. No large pleural effusions.

Hepatobiliary: Limited evaluation of the liver and gallbladder due
to motion artifact. Patient has known hypervascular liver lesions
which are poorly characterized on this examination. In addition,
there is a cystic structure involving the medial left hepatic lobe
which is poorly characterized.

Pancreas: No gross abnormality to the pancreas.

Spleen: Limits evaluation of the spleen.

Adrenals/Urinary Tract: No gross abnormality to the adrenal tissues
or kidneys. Normal appearance of the urinary bladder.

Stomach/Bowel: Moderate amount of stool throughout the entire colon.
No evidence for bowel obstruction.

Vascular/Lymphatic: Normal appearance of the abdominal aorta. No
significant abdominal or pelvic lymphadenopathy. Prominent venous
structures around the right adnexa.

Reproductive: There is a well-defined low-density structure in the
right adnexa measuring 1.8 x 1.8 x 2.2 cm with peripheral
enhancement. Previously, this structure measured up to 3.0 cm and
probably represents a functional follicle or involuting cyst.
Patient has an enlarged fibroid uterus. The uterus extends into the
abdomen and the fundus is at the level of the umbilicus. There
appears to be numerous uterine fibroids. There is a fibroid sitting
directly on top of the urinary bladder. Uterus measures 17.1 x
x 8.8 cm.

Other: Left transgluteal drain situated between the cervix and the
rectum. Small pelvic fluid collection has essentially resolved.
There is a small focus of gas within the decompressed cavity
probably related to the catheter and drain injections. No new
abscess collections.

Musculoskeletal:  No acute bone abnormality.
IMPRESSION: The pelvic abscess collection has resolved. Transgluteal drain
remains well positioned in the pelvis. Plan for drain injection and
possible drain removal.

Enlarged fibroid uterus.

Limited evaluation of the known hepatic lesions due to excessive
motion artifact on this examination.

## 2016-12-22 IMAGING — RF DG SINUS / FISTULA TRACT / ABSCESSOGRAM
6 series · 6 of 6 positions shown · non-contrast
Comparison: none

INDICATION: 45-year-old with history of pelvic abscess and transgluteal drain.
The pelvic fluid collection has resolved on recent CT. Plan for a
drain injection to look for a fistula connection to bowel. No
significant output from the pelvic drain.

[Series 1: run · 1 of 1 slices shown (1 of 6)]
[im 1/1]
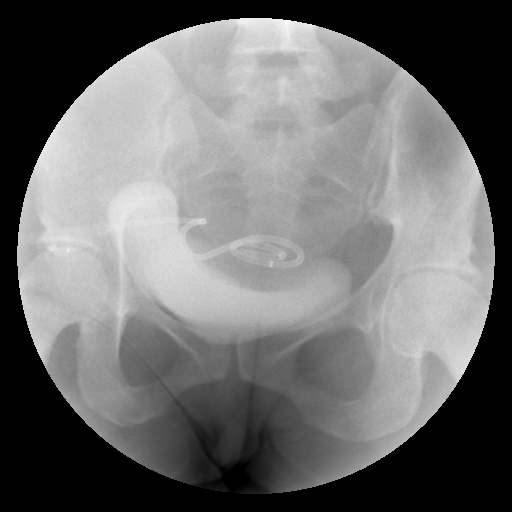

[Series 2: run · 1 of 1 slices shown (2 of 6)]
[im 1/1]
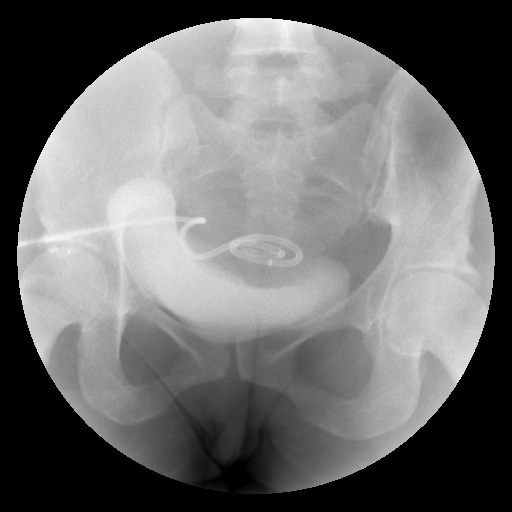

[Series 3: run · 1 of 1 slices shown (3 of 6)]
[im 1/1]
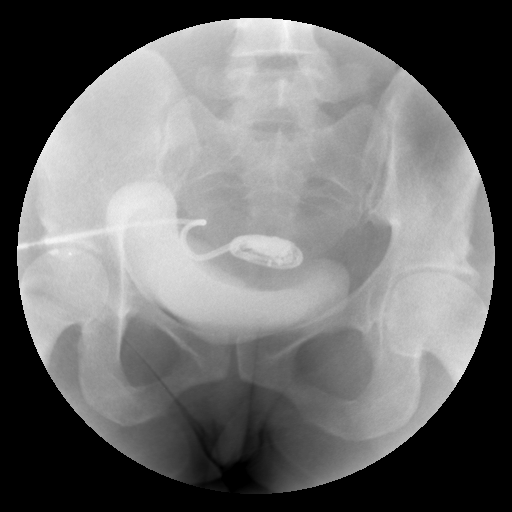

[Series 4: run · 1 of 1 slices shown (4 of 6)]
[im 1/1]
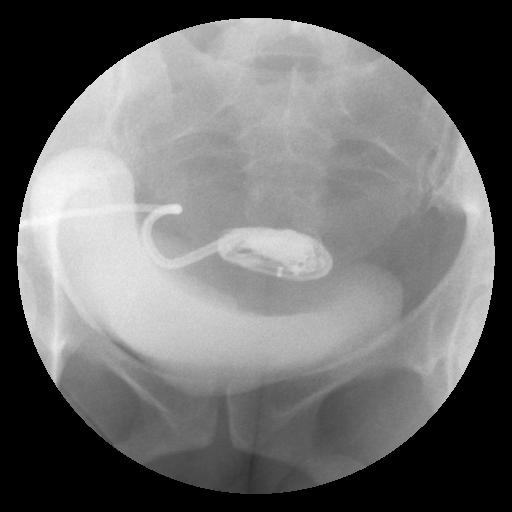

[Series 5: run · 1 of 1 slices shown (5 of 6)]
[im 1/1]
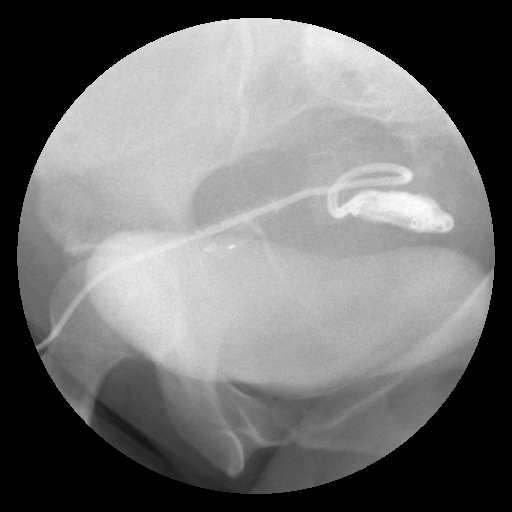

[Series 6: run · 1 of 1 slices shown (6 of 6)]
[im 1/1]
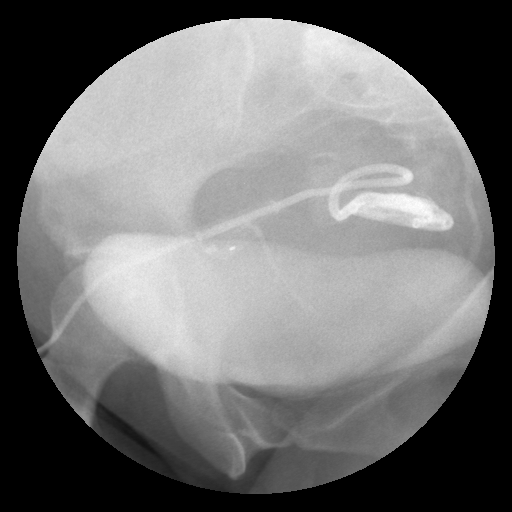

[6 of 6 positions shown; findings below may reference images not displayed]

EXAM:
ABSCESS DRAIN INJECTION WITH FLUOROSCOPY

MEDICATIONS:
None

ANESTHESIA/SEDATION:
None

COMPLICATIONS:
None immediate.

PROCEDURE:
The patient was placed supine on the fluoroscopic table. Spot image
obtained of the pelvis. Approximately 3 mL of Omnipaque 300 was
injected through the drain catheter under fluoroscopic guidance.
Contrast was aspirated. The catheter was cut and removed. Bandage
placed at the drain exit site.
FINDINGS: Drainage catheter in the pelvic midline. Intravenous contrast within
the urinary bladder. There is a decompressed abscess cavity. No
evidence for a bowel fistula.

FLUOROSCOPY TIME:  18 seconds
IMPRESSION: Decompressed abscess cavity. No evidence for a bowel fistula.
Drainage catheter was successfully removed.

## 2016-12-25 ENCOUNTER — Encounter: Payer: Self-pay | Admitting: Obstetrics & Gynecology

## 2017-01-09 ENCOUNTER — Ambulatory Visit
Admission: RE | Admit: 2017-01-09 | Discharge: 2017-01-09 | Disposition: A | Discharge status: Home / Self Care | Payer: No Typology Code available for payment source | Source: Ambulatory Visit | Attending: Obstetrics & Gynecology | Admitting: Obstetrics & Gynecology

## 2017-01-09 DIAGNOSIS — Z1231 Encounter for screening mammogram for malignant neoplasm of breast: Secondary | ICD-10-CM

## 2017-01-14 ENCOUNTER — Other Ambulatory Visit (HOSPITAL_COMMUNITY): Payer: Self-pay | Admitting: Obstetrics & Gynecology

## 2017-01-14 DIAGNOSIS — R928 Other abnormal and inconclusive findings on diagnostic imaging of breast: Secondary | ICD-10-CM

## 2017-01-18 NOTE — Patient Instructions (Addendum)
Your procedure is scheduled on:  Tuesday, Dec 11  Enter through the [employer] of [facility] at:  11:15  Pick up the phone at the desk and dial [phone].  Call this number if you have problems the morning of surgery: [phone].  Remember: Do NOT eat food after Midnight Monday  Do NOT drink clear liquids (including water) after 6:30 am Tuesday  Take these medicines the morning of surgery with a SIP OF WATER: None  Stop herbal medications and supplements at this time.  Do NOT wear jewelry (body piercing), metal hair clips/bobby pins, make-up, or nail polish. Do NOT wear lotions, powders, or perfumes.  You may wear deoderant. Do NOT shave for 48 hours prior to surgery. Do NOT bring valuables to the hospital.  Leave suitcase in car.  After surgery it may be brought to your room.  For patients admitted to the hospital, checkout time is 11:00 AM the day of discharge. Have a responsible adult drive you home and stay with you for 24 hours after your procedure.  Home with [facility] cell [phone].

## 2017-01-23 ENCOUNTER — Other Ambulatory Visit: Payer: Self-pay

## 2017-01-23 ENCOUNTER — Encounter (HOSPITAL_COMMUNITY): Payer: Self-pay

## 2017-01-23 ENCOUNTER — Encounter (HOSPITAL_COMMUNITY)
Admission: RE | Admit: 2017-01-23 | Discharge: 2017-01-23 | Disposition: A | Discharge status: Home / Self Care | Payer: Self-pay | Source: Ambulatory Visit | Attending: Obstetrics & Gynecology | Admitting: Obstetrics & Gynecology

## 2017-01-23 DIAGNOSIS — Z01812 Encounter for preprocedural laboratory examination: Secondary | ICD-10-CM | POA: Insufficient documentation

## 2017-01-23 DIAGNOSIS — D259 Leiomyoma of uterus, unspecified: Secondary | ICD-10-CM | POA: Insufficient documentation

## 2017-01-23 HISTORY — DX: Herpesviral infection, unspecified: B00.9

## 2017-01-23 LAB — CBC
HEMATOCRIT: 39.8 % (ref 36.0–46.0)
Hemoglobin: 12.4 g/dL (ref 12.0–15.0)
MCH: 26.4 pg (ref 26.0–34.0)
MCHC: 31.2 g/dL (ref 30.0–36.0)
MCV: 84.7 fL (ref 78.0–100.0)
PLATELETS: 338 10*3/uL (ref 150–400)
RBC: 4.7 MIL/uL (ref 3.87–5.11)
RDW: 16.8 % — AB (ref 11.5–15.5)
WBC: 11.7 10*3/uL — AB (ref 4.0–10.5)

## 2017-01-23 LAB — ABO/RH: ABO/RH(D): A POS

## 2017-01-29 ENCOUNTER — Encounter (HOSPITAL_COMMUNITY)
Admission: RE | Disposition: A | Discharge status: Home / Self Care | Payer: Self-pay | Source: Ambulatory Visit | Attending: Obstetrics & Gynecology

## 2017-01-29 ENCOUNTER — Inpatient Hospital Stay (HOSPITAL_COMMUNITY): Payer: Self-pay | Admitting: Anesthesiology

## 2017-01-29 ENCOUNTER — Other Ambulatory Visit: Payer: Self-pay

## 2017-01-29 ENCOUNTER — Encounter (HOSPITAL_COMMUNITY): Payer: Self-pay | Admitting: Certified Registered Nurse Anesthetist

## 2017-01-29 ENCOUNTER — Inpatient Hospital Stay (HOSPITAL_COMMUNITY)
Admission: RE | Admit: 2017-01-29 | Discharge: 2017-01-31 | DRG: 743 | Disposition: A | Discharge status: Home / Self Care | Payer: Self-pay | Source: Ambulatory Visit | Attending: Obstetrics & Gynecology | Admitting: Obstetrics & Gynecology

## 2017-01-29 DIAGNOSIS — Z9889 Other specified postprocedural states: Secondary | ICD-10-CM

## 2017-01-29 DIAGNOSIS — N938 Other specified abnormal uterine and vaginal bleeding: Secondary | ICD-10-CM | POA: Diagnosis present

## 2017-01-29 DIAGNOSIS — D259 Leiomyoma of uterus, unspecified: Principal | ICD-10-CM

## 2017-01-29 HISTORY — PX: HYSTERECTOMY ABDOMINAL WITH SALPINGECTOMY: SHX6725

## 2017-01-29 LAB — TYPE AND SCREEN
ABO/RH(D): A POS
ANTIBODY SCREEN: NEGATIVE

## 2017-01-29 LAB — PREGNANCY, URINE: PREG TEST UR: NEGATIVE

## 2017-01-29 SURGERY — HYSTERECTOMY, TOTAL, ABDOMINAL, WITH SALPINGECTOMY
Anesthesia: General | Site: Abdomen | Laterality: Bilateral

## 2017-01-29 MED ORDER — CEFAZOLIN SODIUM-DEXTROSE 2-4 GM/100ML-% IV SOLN
2.0000 g | INTRAVENOUS | Status: AC
  Administered 2017-01-29: 2 g via INTRAVENOUS
  Filled 2017-01-29: qty 100

## 2017-01-29 MED ORDER — ROCURONIUM BROMIDE 100 MG/10ML IV SOLN
INTRAVENOUS | Status: DC | PRN
  Administered 2017-01-29: 60 mg via INTRAVENOUS

## 2017-01-29 MED ORDER — ONDANSETRON HCL 4 MG/2ML IJ SOLN: 4.0000 mg | Freq: Four times a day (QID) | INTRAMUSCULAR | Status: DC | PRN

## 2017-01-29 MED ORDER — LACTATED RINGERS IV SOLN: INTRAVENOUS | Status: DC

## 2017-01-29 MED ORDER — ONDANSETRON HCL 4 MG/2ML IJ SOLN
INTRAMUSCULAR | Status: AC
Start: 2017-01-29 — End: 2017-01-29
  Filled 2017-01-29: qty 2

## 2017-01-29 MED ORDER — CEFAZOLIN SODIUM-DEXTROSE 2-3 GM-%(50ML) IV SOLR
INTRAVENOUS | Status: AC
  Filled 2017-01-29: qty 50

## 2017-01-29 MED ORDER — LIDOCAINE HCL (CARDIAC) 20 MG/ML IV SOLN
INTRAVENOUS | Status: DC | PRN
  Administered 2017-01-29: 80 mg via INTRAVENOUS

## 2017-01-29 MED ORDER — LACTATED RINGERS IV SOLN
INTRAVENOUS | Status: DC
  Administered 2017-01-29: 13:00:00 via INTRAVENOUS
  Administered 2017-01-29: 125 mL/h via INTRAVENOUS

## 2017-01-29 MED ORDER — PROPOFOL 10 MG/ML IV BOLUS
INTRAVENOUS | Status: DC | PRN
  Administered 2017-01-29: 170 mg via INTRAVENOUS

## 2017-01-29 MED ORDER — BUPIVACAINE HCL (PF) 0.5 % IJ SOLN
INTRAMUSCULAR | Status: AC
  Filled 2017-01-29: qty 30

## 2017-01-29 MED ORDER — SUGAMMADEX SODIUM 200 MG/2ML IV SOLN
INTRAVENOUS | Status: DC | PRN
  Administered 2017-01-29: 140 mg via INTRAVENOUS

## 2017-01-29 MED ORDER — OXYCODONE-ACETAMINOPHEN 5-325 MG PO TABS
1.0000 | ORAL_TABLET | ORAL | Status: DC | PRN
  Administered 2017-01-29 – 2017-01-30 (×4): 1 via ORAL
  Filled 2017-01-29 (×2): qty 1
  Filled 2017-01-29: qty 2
  Filled 2017-01-29: qty 1

## 2017-01-29 MED ORDER — BUPIVACAINE HCL (PF) 0.5 % IJ SOLN
INTRAMUSCULAR | Status: DC | PRN
  Administered 2017-01-29: 30 mL

## 2017-01-29 MED ORDER — SCOPOLAMINE 1 MG/3DAYS TD PT72
MEDICATED_PATCH | TRANSDERMAL | Status: AC
  Administered 2017-01-29: 1.5 mg via TRANSDERMAL
  Filled 2017-01-29: qty 1

## 2017-01-29 MED ORDER — SUGAMMADEX SODIUM 200 MG/2ML IV SOLN
INTRAVENOUS | Status: AC
  Filled 2017-01-29: qty 2

## 2017-01-29 MED ORDER — SCOPOLAMINE 1 MG/3DAYS TD PT72
1.0000 | MEDICATED_PATCH | Freq: Once | TRANSDERMAL | Status: DC
  Administered 2017-01-29: 1.5 mg via TRANSDERMAL

## 2017-01-29 MED ORDER — MEPERIDINE HCL 25 MG/ML IJ SOLN
6.2500 mg | INTRAMUSCULAR | Status: DC | PRN
  Administered 2017-01-29: 6.25 mg via INTRAVENOUS

## 2017-01-29 MED ORDER — METOCLOPRAMIDE HCL 5 MG/ML IJ SOLN
10.0000 mg | Freq: Once | INTRAMUSCULAR | Status: DC | PRN
Start: 2017-01-29 — End: 2017-01-29

## 2017-01-29 MED ORDER — ONDANSETRON HCL 4 MG/2ML IJ SOLN
INTRAMUSCULAR | Status: DC | PRN
Start: 2017-01-29 — End: 2017-01-29
  Administered 2017-01-29: 4 mg via INTRAVENOUS

## 2017-01-29 MED ORDER — FENTANYL CITRATE (PF) 100 MCG/2ML IJ SOLN
INTRAMUSCULAR | Status: AC
  Administered 2017-01-29: 50 ug via INTRAVENOUS
  Filled 2017-01-29: qty 2

## 2017-01-29 MED ORDER — FENTANYL CITRATE (PF) 100 MCG/2ML IJ SOLN
25.0000 ug | INTRAMUSCULAR | Status: DC | PRN
  Administered 2017-01-29 (×2): 50 ug via INTRAVENOUS

## 2017-01-29 MED ORDER — PROPOFOL 10 MG/ML IV BOLUS
INTRAVENOUS | Status: AC
  Filled 2017-01-29: qty 20

## 2017-01-29 MED ORDER — FENTANYL CITRATE (PF) 250 MCG/5ML IJ SOLN
INTRAMUSCULAR | Status: AC
  Filled 2017-01-29: qty 5

## 2017-01-29 MED ORDER — ROCURONIUM BROMIDE 100 MG/10ML IV SOLN
INTRAVENOUS | Status: AC
  Filled 2017-01-29: qty 1

## 2017-01-29 MED ORDER — MEPERIDINE HCL 25 MG/ML IJ SOLN
INTRAMUSCULAR | Status: AC
  Administered 2017-01-29: 6.25 mg via INTRAVENOUS
  Filled 2017-01-29: qty 1

## 2017-01-29 MED ORDER — LIDOCAINE HCL (CARDIAC) 20 MG/ML IV SOLN
INTRAVENOUS | Status: AC
  Filled 2017-01-29: qty 5

## 2017-01-29 MED ORDER — DEXAMETHASONE SODIUM PHOSPHATE 4 MG/ML IJ SOLN
INTRAMUSCULAR | Status: AC
  Filled 2017-01-29: qty 1

## 2017-01-29 MED ORDER — MIDAZOLAM HCL 2 MG/2ML IJ SOLN
INTRAMUSCULAR | Status: AC
  Filled 2017-01-29: qty 2

## 2017-01-29 MED ORDER — SCOPOLAMINE 1 MG/3DAYS TD PT72
MEDICATED_PATCH | TRANSDERMAL | Status: AC
  Filled 2017-01-29: qty 1

## 2017-01-29 MED ORDER — ONDANSETRON HCL 4 MG PO TABS: 4.0000 mg | ORAL_TABLET | Freq: Four times a day (QID) | ORAL | Status: DC | PRN

## 2017-01-29 MED ORDER — MIDAZOLAM HCL 2 MG/2ML IJ SOLN
INTRAMUSCULAR | Status: DC | PRN
  Administered 2017-01-29: 2 mg via INTRAVENOUS

## 2017-01-29 MED ORDER — DEXAMETHASONE SODIUM PHOSPHATE 4 MG/ML IJ SOLN
INTRAMUSCULAR | Status: DC | PRN
  Administered 2017-01-29: 4 mg via INTRAVENOUS

## 2017-01-29 MED ORDER — FENTANYL CITRATE (PF) 100 MCG/2ML IJ SOLN
INTRAMUSCULAR | Status: DC | PRN
  Administered 2017-01-29 (×2): 100 ug via INTRAVENOUS
  Administered 2017-01-29 (×3): 50 ug via INTRAVENOUS
  Administered 2017-01-29: 100 ug via INTRAVENOUS
  Administered 2017-01-29: 50 ug via INTRAVENOUS

## 2017-01-29 MED ORDER — HYDROMORPHONE HCL 1 MG/ML IJ SOLN
0.2000 mg | INTRAMUSCULAR | Status: DC | PRN
  Administered 2017-01-29 (×2): 0.6 mg via INTRAVENOUS
  Filled 2017-01-29 (×2): qty 1

## 2017-01-29 SURGICAL SUPPLY — 33 items
CANISTER SUCT 3000ML PPV (MISCELLANEOUS) ×3 IMPLANT
CLOSURE WOUND 1/2 X4 (GAUZE/BANDAGES/DRESSINGS) ×1
CLOTH BEACON ORANGE TIMEOUT ST (SAFETY) ×3 IMPLANT
CONT PATH 16OZ SNAP LID 3702 (MISCELLANEOUS) ×3 IMPLANT
DECANTER SPIKE VIAL GLASS SM (MISCELLANEOUS) IMPLANT
DRAPE CESAREAN BIRTH W POUCH (DRAPES) ×3 IMPLANT
DRAPE WARM FLUID 44X44 (DRAPE) IMPLANT
DRSG OPSITE POSTOP 4X10 (GAUZE/BANDAGES/DRESSINGS) ×3 IMPLANT
DURAPREP 26ML APPLICATOR (WOUND CARE) ×3 IMPLANT
GAUZE SPONGE 4X4 16PLY XRAY LF (GAUZE/BANDAGES/DRESSINGS) ×3 IMPLANT
GLOVE BIO SURGEON STRL SZ 6.5 (GLOVE) ×2 IMPLANT
GLOVE BIO SURGEONS STRL SZ 6.5 (GLOVE) ×1
GLOVE BIOGEL PI IND STRL 7.0 (GLOVE) ×2 IMPLANT
GLOVE BIOGEL PI INDICATOR 7.0 (GLOVE) ×4
GOWN STRL REUS W/TWL LRG LVL3 (GOWN DISPOSABLE) ×9 IMPLANT
HEMOSTAT ARISTA ABSORB 3G PWDR (MISCELLANEOUS) IMPLANT
NEEDLE SPNL 18GX3.5 QUINCKE PK (NEEDLE) ×3 IMPLANT
NS IRRIG 1000ML POUR BTL (IV SOLUTION) ×3 IMPLANT
PACK ABDOMINAL GYN (CUSTOM PROCEDURE TRAY) ×3 IMPLANT
PAD OB MATERNITY 4.3X12.25 (PERSONAL CARE ITEMS) ×3 IMPLANT
PENCIL SMOKE EVAC W/HOLSTER (ELECTROSURGICAL) ×3 IMPLANT
PROTECTOR NERVE ULNAR (MISCELLANEOUS) ×6 IMPLANT
SPONGE LAP 18X18 X RAY DECT (DISPOSABLE) ×6 IMPLANT
STRIP CLOSURE SKIN 1/2X4 (GAUZE/BANDAGES/DRESSINGS) ×2 IMPLANT
SUT CHROMIC 3 0 SH 27 (SUTURE) IMPLANT
SUT PDS AB 0 CTX 60 (SUTURE) ×3 IMPLANT
SUT VIC AB 0 CT1 36 (SUTURE) ×3 IMPLANT
SUT VIC AB 2-0 CT1 18 (SUTURE) ×9 IMPLANT
SUT VIC AB 3-0 CT1 27 (SUTURE) ×2
SUT VIC AB 3-0 CT1 TAPERPNT 27 (SUTURE) ×1 IMPLANT
SYR 30ML LL (SYRINGE) ×3 IMPLANT
TOWEL OR 17X24 6PK STRL BLUE (TOWEL DISPOSABLE) ×6 IMPLANT
TRAY FOLEY CATH SILVER 14FR (SET/KITS/TRAYS/PACK) ×3 IMPLANT

## 2017-01-29 NOTE — Anesthesia Preprocedure Evaluation (Signed)
Anesthesia Evaluation  Patient identified by MRN, date of birth, ID band Patient awake    Reviewed: Allergy & Precautions, NPO status , Patient's Chart, lab work & pertinent test results  Airway Mallampati: II  TM Distance: >3 FB Neck ROM: Full    Dental no notable dental hx.    Pulmonary neg pulmonary ROS,    Pulmonary exam normal breath sounds clear to auscultation       Cardiovascular negative cardio ROS Normal cardiovascular exam Rhythm:Regular Rate:Normal     Neuro/Psych negative neurological ROS  negative psych ROS   GI/Hepatic negative GI ROS, Neg liver ROS,   Endo/Other  negative endocrine ROS  Renal/GU negative Renal ROS  negative genitourinary   Musculoskeletal negative musculoskeletal ROS (+)   Abdominal   Peds negative pediatric ROS (+)  Hematology negative hematology ROS (+)   Anesthesia Other Findings   Reproductive/Obstetrics negative OB ROS                             Anesthesia Physical Anesthesia Plan  ASA: II  Anesthesia Plan: General   Post-op Pain Management:    Induction: Intravenous  PONV Risk Score and Plan: 4 or greater and Ondansetron, Dexamethasone, Midazolam, Scopolamine patch - Pre-op and Treatment may vary due to age or medical condition  Airway Management Planned: Oral ETT  Additional Equipment:   Intra-op Plan:   Post-operative Plan: Extubation in OR  Informed Consent: I have reviewed the patients History and Physical, chart, labs and discussed the procedure including the risks, benefits and alternatives for the proposed anesthesia with the patient or authorized representative who has indicated his/her understanding and acceptance.   Dental advisory given  Plan Discussed with: CRNA  Anesthesia Plan Comments:         Anesthesia Quick Evaluation  

## 2017-01-29 NOTE — Transfer of Care (Signed)
Immediate Anesthesia Transfer of Care Note  Patient: Cheryl Grant  Procedure(s) Performed: SUPRACERVICAL  ABDOMINAL HYSTERECTOMY WITH SALPINGECTOMY (Bilateral Abdomen)  Patient Location: PACU  Anesthesia Type:General  Level of Consciousness: awake, alert , oriented, drowsy and patient cooperative  Airway & Oxygen Therapy: Patient Spontanous Breathing and Patient connected to nasal cannula oxygen  Post-op Assessment: Report given to RN and Post -op Vital signs reviewed and stable  Post vital signs: Reviewed and stable  Last Vitals:  Vitals:   01/29/17 1154  BP: 136/83  Pulse: (!) 109  Resp: 16  Temp: 36.6 C  SpO2: 99%    Last Pain:  Vitals:   01/29/17 1154  TempSrc: Oral      Patients Stated Pain Goal: 3 (32/76/14 7092)  Complications: No apparent anesthesia complications

## 2017-01-29 NOTE — Op Note (Signed)
01/29/2017  1:54 PM  PATIENT:  Cheryl Grant  46 y.o. female  PRE-OPERATIVE DIAGNOSIS:  Fibroids, dysfunctional uterine bleeding DUB  POST-OPERATIVE DIAGNOSIS:  fibroids, dysfunctional uterine bleeding  PROCEDURE:  Procedure(s): SUPRACERVICAL  ABDOMINAL HYSTERECTOMY WITH SALPINGECTOMY (Bilateral)  SURGEON:  Surgeon(s) and Role:    * Boden Stucky, Wilhemina Cash, MD - Primary    * Lavonia Drafts, MD - Assisting   ANESTHESIA:   local and general  EBL:  300 mL   BLOOD ADMINISTERED:none  DRAINS: none   LOCAL MEDICATIONS USED:  MARCAINE     SPECIMEN:  Source of Specimen:  uterus, without cervix, bilateral oviducts  DISPOSITION OF SPECIMEN:  PATHOLOGY  COUNTS:  YES  TOURNIQUET:  * No tourniquets in log *  DICTATION: .Dragon Dictation  PLAN OF CARE: Admit to inpatient   PATIENT DISPOSITION:  PACU - hemodynamically stable.   Delay start of Pharmacological VTE agent (>24hrs) due to surgical blood loss or risk of bleeding: not applicable     The risks, benefits, and alternatives of surgery were explained, understood, and accepted. Consents were signed. All questions were answered. She was taken to the operating room and general anesthesia was applied without complication. Her abdomen and vagina were prepped and draped in the usual sterile fashion. A Foley catheter was placed which drained clear urine throughout the case. A transverse incision was made approximately 2 cm above her symphysis pubis after injecting 30 mL of 0.5% marcaine in the subcutaneous tissue. The incision was carried down through the subcutaneous tissue to the fascia. Bleeding encountered was cauterized with the Bovie. The fascia was scored the midline and the fascial incision was extended bilaterally. The pyramidalis muscles were separated in a transverse fashion using electrosurgical technique. Approximately 2 cm of the rectus muscles were separated in a transverse fashion in the midline using electrosurgical  technique. Hemostasis was maintained. The peritoneum was entered with hemostats and the peritoneal incision was extended bilaterally with the Bovie, taking care to avoid bowel and bladder. The patient was placed in Trendelenburg position and her bowel was packed out of the abdominal cavity. The pelvis was inspected. Her very large uterus filled the entire pelvis. I used towel clamps to elevate the uterus out of the incision. Coker clamps were used to elevate the uterus. The round ligaments were identified clamped cut and ligated.There were filmy adhesions of the bowel to the cervix in the back and dense adhesions of the bladder to the front of there uterus and cervix. I carefully took down the posterior adhesions. I then packed the bowel out of the pelvis with moist lap sponges.  A bladder flap was created anteriorly and the bladder was pushed out of the operative site with a moist lap sponge. The uteroovarian ligaments were identified bilaterally. They were clamped, cut, and ligated. The ovaries were very close to the uterus. Excellent hemostasis was noted. 2-0 Vicryl sutures used throughout this case unless otherwise specified. The uterine vessels were skeletonized, clamped, cut, and doubly ligated. The large uterus was amputated. Due to the density of the bladder adhesions to the cervix, I decided to do a supracervical hysterectomy. I used to Bovie to cauterize the inside of the cervix, hopefully destroying the endocervical cells that could potentially respond to cyclic estrogen. I then closed the cervical stump with a running locking 0 vicryl suture.   All pedicles were noted to be hemostatic.  The ureters were noted to be functioning and of normal caliber. The sponges were removed from the pelvis.  The rectus muscles were inspected and hemostasis was assured. The fascia was closed with a 0 Vicryl running nonlocking suture. The subcutaneous tissue was irrigated, clean, dry.  A subcuticular closure was done with  3-0 vicryl suture. She tolerated the procedure well and was taken to the recovery room in stable condition. Her Foley catheter drained clear urine throughout.

## 2017-01-29 NOTE — H&P (Signed)
Cheryl Grant is an 46 y.o. engaged AA P23 (60 yo son) here today for a hysterectomy and removal of her oviducts. She has known about her fibroids "forever" but since about 4/18 she has had irregular bleeding. She had a MRI in 2017 that confirmed large fibroids. Her EMBX was normal recently. She was started on megace here in May, takes it daily. She spotting anyway. She has gained 15 #. She has a h/o dysparueunia, "numbness" sometimes with sex.     No LMP recorded. Patient is not currently having periods (Reason: Oral contraceptives).    Past Medical History:  Diagnosis Date  . Colitis   . HSV infection   . Uterine fibroid     Past Surgical History:  Procedure Laterality Date  . CESAREAN SECTION  2003   Primary cesarean section, Kroneg incision  . ROBOT ASSISTED MYOMECTOMY  2007   Dr Dellis Filbert  . WISDOM TOOTH EXTRACTION      Family History  Problem Relation Age of Onset  . Breast cancer Neg Hx     Social History:  reports that  has never smoked. she has never used smokeless tobacco. She reports that she drinks alcohol. She reports that she does not use drugs.  Allergies:  Allergies  Allergen Reactions  . Ibuprofen Hives    Medications Prior to Admission  Medication Sig Dispense Refill Last Dose  . Ascorbic Acid (VITAMIN C PO) Take 1 tablet by mouth 3 (three) times a week.     . IRON PO Take 1 tablet by mouth 3 (three) times a week.     . megestrol (MEGACE) 40 MG tablet TAKE 2 TABLETS BY MOUTH THREE TIMES DAILY 180 tablet 1 Taking  . Multiple Vitamin (MULTIVITAMIN WITH MINERALS) TABS tablet Take 1 tablet by mouth 3 (three) times a week.     . metroNIDAZOLE (FLAGYL) 500 MG tablet Take 1 tablet (500 mg total) by mouth 2 (two) times daily. (Patient not taking: Reported on 01/15/2017) 14 tablet 0 Not Taking at Unknown time    ROS  There were no vitals taken for this visit. Physical Exam Breathing, conversing, and ambulating normally Heart- rrr Lungs- CTAB Abd- benign,  uterus palpable to her umbilicus  No results found for this or any previous visit (from the past 24 hour(s)).  No results found.  Assessment/Plan: Symptomatic fibroids - plan for TAH/BS  She understands the risks of surgery, including, but not to infection, bleeding, DVTs, damage to bowel, bladder, ureters. She wishes to proceed.     Emily Filbert 01/29/2017, 11:52 AM

## 2017-01-29 NOTE — Anesthesia Procedure Notes (Signed)
Procedure Name: Intubation Date/Time: 01/29/2017 12:50 PM Performed by: Raenette Rover, CRNA Pre-anesthesia Checklist: Patient identified, Emergency Drugs available, Suction available and Patient being monitored Patient Re-evaluated:Patient Re-evaluated prior to induction Oxygen Delivery Method: Circle system utilized Preoxygenation: Pre-oxygenation with 100% oxygen Induction Type: IV induction Ventilation: Mask ventilation without difficulty Laryngoscope Size: Mac and 3 Grade View: Grade I Tube type: Oral Tube size: 7.0 mm Number of attempts: 1 Airway Equipment and Method: Stylet Placement Confirmation: ETT inserted through vocal cords under direct vision,  positive ETCO2,  CO2 detector and breath sounds checked- equal and bilateral Secured at: 19 cm Tube secured with: Tape Dental Injury: Teeth and Oropharynx as per pre-operative assessment

## 2017-01-30 ENCOUNTER — Encounter (HOSPITAL_COMMUNITY): Payer: Self-pay

## 2017-01-30 ENCOUNTER — Encounter (HOSPITAL_COMMUNITY): Payer: Self-pay | Admitting: Obstetrics & Gynecology

## 2017-01-30 LAB — CBC
HEMATOCRIT: 29.5 % — AB (ref 36.0–46.0)
HEMOGLOBIN: 9.3 g/dL — AB (ref 12.0–15.0)
MCH: 26.4 pg (ref 26.0–34.0)
MCHC: 31.5 g/dL (ref 30.0–36.0)
MCV: 83.8 fL (ref 78.0–100.0)
Platelets: 434 10*3/uL — ABNORMAL HIGH (ref 150–400)
RBC: 3.52 MIL/uL — ABNORMAL LOW (ref 3.87–5.11)
RDW: 16.7 % — ABNORMAL HIGH (ref 11.5–15.5)
WBC: 13 10*3/uL — ABNORMAL HIGH (ref 4.0–10.5)

## 2017-01-30 MED ORDER — OXYCODONE-ACETAMINOPHEN 5-325 MG PO TABS: 1.0000 | ORAL_TABLET | ORAL | 0 refills | Status: DC | PRN

## 2017-01-30 NOTE — Anesthesia Postprocedure Evaluation (Signed)
Anesthesia Post Note  Patient: Cheryl Grant  Procedure(s) Performed: SUPRACERVICAL  ABDOMINAL HYSTERECTOMY WITH SALPINGECTOMY (Bilateral Abdomen)     Patient location during evaluation: PACU Anesthesia Type: General Level of consciousness: awake and alert Pain management: pain level controlled Vital Signs Assessment: post-procedure vital signs reviewed and stable Respiratory status: spontaneous breathing, nonlabored ventilation, respiratory function stable and patient connected to nasal cannula oxygen Cardiovascular status: blood pressure returned to baseline and stable Postop Assessment: no apparent nausea or vomiting Anesthetic complications: no    Last Vitals:  Vitals:   01/30/17 0314 01/30/17 0800  BP: 123/61 116/68  Pulse: 65 75  Resp: 18 18  Temp: 36.8 C 36.7 C  SpO2: 98% 99%    Last Pain:  Vitals:   01/30/17 0840  TempSrc:   PainSc: 3                  Montez Hageman

## 2017-01-30 NOTE — Progress Notes (Signed)
1 Day Post-Op Procedure(s) (LRB): SUPRACERVICAL  ABDOMINAL HYSTERECTOMY WITH SALPINGECTOMY (Bilateral)  Subjective: Patient reports tolerating PO, + flatus and no problems voiding.    Objective: I have reviewed patient's vital signs, intake and output, medications and labs.  General: alert Resp: clear to auscultation bilaterally Cardio: regular rate and rhythm, S1, S2 normal, no murmur, click, rub or gallop GI: soft, non-tender; bowel sounds normal; no masses,  no organomegaly Honeycomb dressing: clean, dry, intact Assessment: s/p Procedure(s): SUPRACERVICAL  ABDOMINAL HYSTERECTOMY WITH SALPINGECTOMY (Bilateral): stable  Plan: Encourage ambulation  LOS: 1 day    Kamsiyochukwu Spickler C Quanita Barona 01/30/2017, 1:33 PM

## 2017-01-30 NOTE — Discharge Instructions (Signed)
Abdominal Hysterectomy, Care After °This sheet gives you information about how to care for yourself after your procedure. Your doctor may also give you more specific instructions. If you have problems or questions, contact your doctor. °Follow these instructions at home: °Bathing °· Do not take baths, swim, or use a hot tub until your doctor says it is okay. Ask your doctor if you can take showers. You may only be allowed to take sponge baths for bathing. °· Keep the bandage (dressing) dry until your doctor says it can be taken off. °Surgical cut ( °incision) care °· Follow instructions from your doctor about how to take care of your cut from surgery. Make sure you: °? Wash your hands with soap and water before you change your bandage (dressing). If you cannot use soap and water, use hand sanitizer. °? Change your bandage as told by your doctor. °? Leave stitches (sutures), skin glue, or skin tape (adhesive) strips in place. They may need to stay in place for 2 weeks or longer. If tape strips get loose and curl up, you may trim the loose edges. Do not remove tape strips completely unless your doctor says it is okay. °· Check your surgical cut area every day for signs of infection. Check for: °? Redness, swelling, or pain. °? Fluid or blood. °? Warmth. °? Pus or a bad smell. °Activity °· Do gentle, daily exercise as told by your doctor. You may be told to take short walks every day and go farther each time. °· Do not lift anything that is heavier than 10 lb (4.5 kg), or the limit that your doctor tells you, until he or she says that it is safe. °· Do not drive or use heavy machinery while taking prescription pain medicine. °· Do not drive for 24 hours if you were given a medicine to help you relax (sedative). °· Follow your doctor's advice about exercise, driving, and general activities. Ask your doctor what activities are safe for you. °Lifestyle °· Do not douche, use tampons, or have sex for at least 6 weeks or as  told by your doctor. °· Do not drink alcohol until your doctor says it is okay. °· Drink enough fluid to keep your pee (urine) clear or pale yellow. °· Try to have someone at home with you for the first 1-2 weeks to help. °· Do not use any products that contain nicotine or tobacco, such as cigarettes and e-cigarettes. These can slow down healing. If you need help quitting, ask your doctor. °General instructions °· Take over-the-counter and prescription medicines only as told by your doctor. °· Do not take aspirin or ibuprofen. These medicines can cause bleeding. °· To prevent or treat constipation while you are taking prescription pain medicine, your doctor may suggest that you: °? Drink enough fluid to keep your urine clear or pale yellow. °? Take over-the-counter or prescription medicines. °? Eat foods that are high in fiber, such as: °§ Fresh fruits and vegetables. °§ Whole grains. °§ Beans. °? Limit foods that are high in fat and processed sugars, such as fried and sweet foods. °· Keep all follow-up visits as told by your doctor. This is important. °Contact a doctor if: °· You have chills or fever. °· You have redness, swelling, or pain around your cut. °· You have fluid or blood coming from your cut. °· Your cut feels warm to the touch. °· You have pus or a bad smell coming from your cut. °· Your cut breaks   open. °· You feel dizzy or light-headed. °· You have pain or bleeding when you pee. °· You keep having watery poop (diarrhea). °· You keep feeling sick to your stomach (nauseous) or keep throwing up (vomiting). °· You have unusual fluid (discharge) coming from your vagina. °· You have a rash. °· You have a reaction to your medicine. °· Your pain medicine does not help. °Get help right away if: °· You have a fever and your symptoms get worse all of a sudden. °· You have very bad belly (abdominal) pain. °· You are short of breath. °· You pass out (faint). °· You have pain, swelling, or redness of your  leg. °· You bleed a lot from your vagina and notice clumps of blood (clots). °Summary °· Do not take baths, swim, or use a hot tub until your doctor says it is okay. Ask your doctor if you can take showers. You may only be allowed to take sponge baths for bathing. °· Follow your doctor's advice about exercise, driving, and general activities. Ask your doctor what activities are safe for you. °· Do not lift anything that is heavier than 10 lb (4.5 kg), or the limit that your doctor tells you, until he or she says that it is safe. °· Try to have someone at home with you for the first 1-2 weeks to help. °This information is not intended to replace advice given to you by your health care provider. Make sure you discuss any questions you have with your health care provider. °Document Released: 11/15/2007 Document Revised: 01/25/2016 Document Reviewed: 01/25/2016 °Elsevier Interactive Patient Education © 2017 Elsevier Inc. ° °

## 2017-01-31 MED ORDER — ACETAMINOPHEN 325 MG PO TABS: 650.0000 mg | ORAL_TABLET | Freq: Four times a day (QID) | ORAL | Status: DC | PRN

## 2017-01-31 NOTE — Progress Notes (Signed)
Discharge instructions given to patient and reviewed. Surgical pamphlet given to patient, f/u appt made and instructions on removal of honeycomb dressing 3 days from now in the shower. Pt verbalizes understanding.

## 2017-01-31 NOTE — Discharge Summary (Signed)
Physician Discharge Summary  Patient ID: Cheryl Grant MRN: [id number] DOB/AGE: Dec 20, 1970 46 y.o.  Admit date: 01/29/2017 Discharge date: 01/31/2017  Admission Diagnoses: symptomatic fibroids  Discharge Diagnoses: same Active Problems:   Post-operative state   Discharged Condition: good  Hospital Course: She underwent an uncomplicated supracervical hysterectomy, bilateral salpingectomy. By POD #1 she was voiding, ambulating, and tolerating po well. By POD #2 she was having flatus and voiced her readiness to go home.  Consults: None  Significant Diagnostic Studies: labs: Post op hbg 9.3, pre op, 12.4  Treatments: surgery: as above  Discharge Exam: Blood pressure 113/66, pulse 72, temperature 98.2 F (36.8 C), temperature source Oral, resp. rate 18, height 5' (1.524 m), weight 69.4 kg (153 lb), SpO2 100 %. General appearance: alert Resp: clear to auscultation bilaterally Cardio: regular rate and rhythm, S1, S2 normal, no murmur, click, rub or gallop GI: soft, non-tender; bowel sounds normal; no masses,  no organomegaly Incision/Wound: Honeycomb dressing- clean, dry, intact  Disposition: 01-Home or Self Care   Allergies as of 01/31/2017      Reactions   Ibuprofen Hives      Medication List    STOP taking these medications   megestrol 40 MG tablet Commonly known as:  MEGACE   metroNIDAZOLE 500 MG tablet Commonly known as:  FLAGYL     TAKE these medications   IRON PO Take 1 tablet by mouth 3 (three) times a week.   multivitamin with minerals Tabs tablet Take 1 tablet by mouth 3 (three) times a week.   oxyCODONE-acetaminophen 5-325 MG tablet Commonly known as:  PERCOCET/ROXICET Take 1-2 tablets by mouth every 4 (four) hours as needed for severe pain (moderate to severe pain (when tolerating fluids)).   VITAMIN C PO Take 1 tablet by mouth 3 (three) times a week.      Follow-up Information    Emily Filbert, MD. Schedule an appointment as soon as possible  for a visit in 6 week(s).   Specialty:  Obstetrics and Gynecology Contact information: [place] [address] [phone]           Signed: Emily Filbert 01/31/2017, 3:34 PM

## 2017-02-01 ENCOUNTER — Other Ambulatory Visit (HOSPITAL_COMMUNITY): Payer: Self-pay | Admitting: *Deleted

## 2017-02-01 DIAGNOSIS — R928 Other abnormal and inconclusive findings on diagnostic imaging of breast: Secondary | ICD-10-CM

## 2017-02-07 ENCOUNTER — Telehealth: Payer: Self-pay | Admitting: General Practice

## 2017-02-07 NOTE — Telephone Encounter (Signed)
Patient called and left message on nurse line stating she had surgery last week with Dr Hulan Fray & her incision is bleeding. Patient states she can see bleeding on the bandage and it feels warm. Called patient stating I am returning her phone call. Asked patient if the large bandage was still on and if she was seeing dried blood on there. Patient states yes to both. Told patient to gently remove the honeycomb dressing and tell me what the incision looks like after. Patient removed dressing and reports incision is clean dry & intact. Patient states the blood was on the bandage only. Told patient that is okay that normally happens. Reviewed incision cleaning instructions & signs and symptoms of infection. Patient verbalized understanding to all & had no questions

## 2017-03-13 ENCOUNTER — Encounter: Payer: Self-pay | Admitting: Obstetrics & Gynecology

## 2017-03-13 ENCOUNTER — Ambulatory Visit (INDEPENDENT_AMBULATORY_CARE_PROVIDER_SITE_OTHER): Payer: Self-pay | Admitting: Obstetrics & Gynecology

## 2017-03-13 VITALS — BP 150/81 | HR 83 | Ht 60.0 in | Wt 146.6 lb

## 2017-03-13 DIAGNOSIS — Z9889 Other specified postprocedural states: Secondary | ICD-10-CM

## 2017-03-13 MED ORDER — VALACYCLOVIR HCL 1 G PO TABS: 1000.0000 mg | ORAL_TABLET | Freq: Two times a day (BID) | ORAL | 3 refills | Status: AC

## 2017-03-13 NOTE — Progress Notes (Signed)
   Subjective:    Patient ID: Cheryl Grant, female    DOB: October 16, 1970, 47 y.o.   MRN: [id number]  HPI  47 yo lady here for a 6 week post op visit. She had a supracervical hysterectomy and bilateral salpingectomy. She has had no problems, reports that voiding is easier now. She has not had sex yet. She reports normal bowel function.  Review of Systems     Objective:   Physical Exam Breathing, conversing, and ambulating normally Well nourished, well hydrated Black female, no apparent distress Abd- benign Incision- healed well Bimanual exam normal, no masses and tender     Assessment & Plan:  Postop doing well Come back for pap in 3 years/prn sooner She wants a refill for valtrex prn

## 2017-03-14 ENCOUNTER — Ambulatory Visit
Admission: RE | Admit: 2017-03-14 | Discharge: 2017-03-14 | Disposition: A | Discharge status: Home / Self Care | Payer: No Typology Code available for payment source | Source: Ambulatory Visit | Attending: Obstetrics and Gynecology | Admitting: Obstetrics and Gynecology

## 2017-03-14 ENCOUNTER — Encounter (HOSPITAL_COMMUNITY): Payer: Self-pay

## 2017-03-14 ENCOUNTER — Ambulatory Visit (HOSPITAL_COMMUNITY)
Admission: RE | Admit: 2017-03-14 | Discharge: 2017-03-14 | Disposition: A | Discharge status: Home / Self Care | Payer: No Typology Code available for payment source | Source: Ambulatory Visit | Attending: Obstetrics and Gynecology | Admitting: Obstetrics and Gynecology

## 2017-03-14 ENCOUNTER — Other Ambulatory Visit (HOSPITAL_COMMUNITY): Payer: Self-pay | Admitting: Obstetrics & Gynecology

## 2017-03-14 VITALS — BP 118/82 | Ht 60.0 in | Wt 150.0 lb

## 2017-03-14 DIAGNOSIS — R928 Other abnormal and inconclusive findings on diagnostic imaging of breast: Secondary | ICD-10-CM

## 2017-03-14 DIAGNOSIS — Z1239 Encounter for other screening for malignant neoplasm of breast: Secondary | ICD-10-CM

## 2017-03-14 DIAGNOSIS — N63 Unspecified lump in unspecified breast: Secondary | ICD-10-CM

## 2017-03-14 NOTE — Progress Notes (Signed)
Patient referred to [facility] by the [facility] due to recommending additional imaging of the right breast. Screening mammogram completed 01/09/2017.  Pap Smear: Pap smear not completed today. Last Pap smear was 11/12/2016 at the [facility] and normal with negative HPV. Per patient has a history of an abnormal Pap smear 25 years ago that a repeat Pap smear was completed for follow-up. Per patient all Pap smears have been normal since repeat Pap smear. Last Pap smear result is in Epic.  Physical exam: Breasts Left breast slightly larger than right breast that per patient is normal for her. No skin abnormalities bilateral breasts. No nipple retraction bilateral breasts. No nipple discharge bilateral breasts. No lymphadenopathy. No lumps palpated bilateral breasts. No complaints of pain or tenderness on exam. Referred patient to the [facility] for a right breast diagnostic mammogram and breast ultrasound per recommendation. Appointment scheduled for Thursday, March 14, 2017 at 1050.        Pelvic/Bimanual No Pap smear completed today since last Pap smear and HPV typing was 11/12/2016. Pap smear not indicated per BCCCP guidelines.   Smoking History: Patient has never smoked.  Patient Navigation: Patient education provided. Access to services provided for patient through [facility] program.

## 2017-03-14 NOTE — Patient Instructions (Addendum)
Explained breast self awareness with Trinda Pascal. Patient did not need a Pap smear today due to last Pap smear was 11/12/2016. Let her know BCCCP will cover Pap smears and HPV typing every 5 years unless has a history of abnormal Pap smears. Referred patient to the [facility] for a right breast diagnostic mammogram and breast ultrasound per recommendation. Appointment scheduled for Thursday, March 14, 2017 at 1050. Geraldine Solar Eckels verbalized understanding.  Traniya Prichett, Arvil Chaco, RN 12:53 PM

## 2017-03-15 ENCOUNTER — Encounter (HOSPITAL_COMMUNITY): Payer: Self-pay | Admitting: *Deleted

## 2017-03-28 ENCOUNTER — Ambulatory Visit (INDEPENDENT_AMBULATORY_CARE_PROVIDER_SITE_OTHER): Payer: Self-pay | Admitting: General Practice

## 2017-03-28 DIAGNOSIS — N898 Other specified noninflammatory disorders of vagina: Secondary | ICD-10-CM

## 2017-03-28 DIAGNOSIS — Z113 Encounter for screening for infections with a predominantly sexual mode of transmission: Secondary | ICD-10-CM

## 2017-03-28 NOTE — Progress Notes (Signed)
Chart reviewed for nurse visit. Agree with plan of care.   [place], [place] 03/28/2017 5:06 PM

## 2017-03-28 NOTE — Progress Notes (Signed)
Patient here today for self swab. Patient reports greenish d/c with odor. Patient instructed in self swab and specimen collected. Discussed with patient we will contact her with any abnormal results. Patient verbalized understanding and had no questions

## 2017-03-29 LAB — CERVICOVAGINAL ANCILLARY ONLY
Bacterial vaginitis: POSITIVE — AB
Candida vaginitis: NEGATIVE
TRICH (WINDOWPATH): NEGATIVE

## 2017-04-01 ENCOUNTER — Telehealth: Payer: Self-pay | Admitting: *Deleted

## 2017-04-01 DIAGNOSIS — N760 Acute vaginitis: Principal | ICD-10-CM

## 2017-04-01 DIAGNOSIS — B9689 Other specified bacterial agents as the cause of diseases classified elsewhere: Secondary | ICD-10-CM

## 2017-04-01 MED ORDER — METRONIDAZOLE 0.75 % VA GEL: 1.0000 | Freq: Every day | VAGINAL | 0 refills | Status: AC

## 2017-04-01 NOTE — Telephone Encounter (Signed)
[name] called again today and left a voicemail she is wanting results of her self swab - either a call or MyChart message.

## 2017-04-01 NOTE — Telephone Encounter (Signed)
[name] called 03/29/17 pm and left a message she was seen for a self swab and has't heard anything.

## 2017-04-01 NOTE — Telephone Encounter (Signed)
I called Cheryl Grant and notified her self swab shows BV.She states she has before and hates taking those pills. I informed her there is a vaginal gel if she prefers ; but has higher copay. She prefers vaginal gel, will send in rx. Asked her to let us know if copay too high.

## 2017-04-04 ENCOUNTER — Telehealth: Payer: Self-pay | Admitting: *Deleted

## 2017-04-04 DIAGNOSIS — N760 Acute vaginitis: Principal | ICD-10-CM

## 2017-04-04 DIAGNOSIS — B9689 Other specified bacterial agents as the cause of diseases classified elsewhere: Secondary | ICD-10-CM

## 2017-04-04 MED ORDER — METRONIDAZOLE 500 MG PO TABS: 500.0000 mg | ORAL_TABLET | Freq: Two times a day (BID) | ORAL | 0 refills | Status: AC

## 2017-04-04 NOTE — Telephone Encounter (Signed)
Cheryl Grant called and left a voice mail yesterday 3pm stating she was prescribed metrogel Monday and was told if it was too expensive she could call and request the flagyl pills. States is too expensive and would like pills sent to her pharmacy.    RX sent to pharmacy . Called [place] and notified her.

## 2017-09-12 ENCOUNTER — Ambulatory Visit
Admission: RE | Admit: 2017-09-12 | Discharge: 2017-09-12 | Disposition: A | Discharge status: Home / Self Care | Payer: No Typology Code available for payment source | Source: Ambulatory Visit | Attending: Obstetrics & Gynecology | Admitting: Obstetrics & Gynecology

## 2017-09-12 DIAGNOSIS — N63 Unspecified lump in unspecified breast: Secondary | ICD-10-CM

## 2017-09-13 ENCOUNTER — Other Ambulatory Visit (HOSPITAL_COMMUNITY): Payer: Self-pay | Admitting: Obstetrics & Gynecology

## 2017-12-23 ENCOUNTER — Other Ambulatory Visit (HOSPITAL_COMMUNITY): Payer: Self-pay | Admitting: *Deleted

## 2017-12-23 DIAGNOSIS — N6310 Unspecified lump in the right breast, unspecified quadrant: Secondary | ICD-10-CM

## 2018-01-10 ENCOUNTER — Other Ambulatory Visit (HOSPITAL_COMMUNITY): Payer: Self-pay | Admitting: *Deleted

## 2018-03-20 ENCOUNTER — Ambulatory Visit (HOSPITAL_COMMUNITY): Payer: Self-pay

## 2018-03-20 ENCOUNTER — Other Ambulatory Visit: Payer: Self-pay

## 2018-04-17 ENCOUNTER — Ambulatory Visit
Admission: RE | Admit: 2018-04-17 | Discharge: 2018-04-17 | Disposition: A | Discharge status: Home / Self Care | Payer: No Typology Code available for payment source | Source: Ambulatory Visit | Attending: Obstetrics and Gynecology | Admitting: Obstetrics and Gynecology

## 2018-04-17 ENCOUNTER — Ambulatory Visit (HOSPITAL_COMMUNITY)
Admission: RE | Admit: 2018-04-17 | Discharge: 2018-04-17 | Disposition: A | Discharge status: Home / Self Care | Payer: Self-pay | Source: Ambulatory Visit | Attending: Obstetrics and Gynecology | Admitting: Obstetrics and Gynecology

## 2018-04-17 ENCOUNTER — Encounter (HOSPITAL_COMMUNITY): Payer: Self-pay

## 2018-04-17 VITALS — BP 112/74

## 2018-04-17 DIAGNOSIS — Z1239 Encounter for other screening for malignant neoplasm of breast: Secondary | ICD-10-CM

## 2018-04-17 DIAGNOSIS — N6310 Unspecified lump in the right breast, unspecified quadrant: Secondary | ICD-10-CM

## 2018-04-17 NOTE — Progress Notes (Signed)
Patient referred to [facility] by the [facility] due to recommending additional imaging of the right breast. Screening mammogram completed 01/09/2017.  Pap Smear: Pap smear not completed today. Last Pap smear was 11/12/2016 at the [facility] and normal with negative HPV. Per patient has a history of an abnormal Pap smear 26 years ago that a repeat Pap smear was completed for follow-up. Per patient all Pap smears have been normal since repeat Pap smear. Last Pap smear result is in Epic.  Physical exam: Breasts Left breast slightly larger than right breast that per patient is normal for her. No skin abnormalities bilateral breasts. No nipple retraction bilateral breasts. No nipple discharge bilateral breasts. No lymphadenopathy. No lumps palpated bilateral breasts. No complaints of pain or tenderness on exam. Referred patient to the [facility] for a diagnostic mammogram and right breast ultrasound per recommendation. Appointment scheduled for Thursday, April 17, 2018 at 1250.         Pelvic/Bimanual No Pap smear completed today since last Pap smear and HPV typing was 11/12/2016. Pap smear not indicated per BCCCP guidelines.   Smoking History: Patient has never smoked.  Patient Navigation: Patient education provided. Access to services provided for patient through BCCCP program.   Breast and Cervical Cancer Risk Assessment: Patient has no family history of breast cancer, known genetic mutations, or radiation treatment to the chest before age 87. Patient has no history of cervical dysplasia, immunocompromised, or DES exposure in-utero.  Risk Assessment    Risk Scores      04/17/2018   Last edited by: Armond Hang, LPN   5-year risk: 0.9 %   Lifetime risk: 9.2 %

## 2018-04-17 NOTE — Patient Instructions (Signed)
Explained breast self awareness with Cheryl Grant. Patient did not need a Pap smear today due to last Pap smear and HPV typing was 11/12/2016. Let her know BCCCP will cover Pap smears and HPV typing every 5 years unless has a history of abnormal Pap smears. Referred patient to the [facility] for a diagnostic mammogram and right breast ultrasound per recommendation. Appointment scheduled for Thursday, April 17, 2018 at 1250. Patient aware of appointment and will be there.Cheryl Grant verbalized understanding.  Cheryl Grant, Arvil Chaco, RN 11:44 AM

## 2018-04-23 ENCOUNTER — Encounter (HOSPITAL_COMMUNITY): Payer: Self-pay | Admitting: *Deleted

## 2019-05-27 ENCOUNTER — Other Ambulatory Visit: Payer: Self-pay

## 2019-05-27 DIAGNOSIS — N644 Mastodynia: Secondary | ICD-10-CM

## 2019-05-27 NOTE — Addendum Note (Signed)
Addended by: Demetrius Revel on: 05/27/2019 02:10 PM   Modules accepted: Orders

## 2019-05-27 NOTE — Progress Notes (Signed)
Error

## 2019-06-16 ENCOUNTER — Ambulatory Visit
Admission: RE | Admit: 2019-06-16 | Discharge: 2019-06-16 | Disposition: A | Discharge status: Home / Self Care | Payer: No Typology Code available for payment source | Source: Ambulatory Visit | Attending: Obstetrics and Gynecology | Admitting: Obstetrics and Gynecology

## 2019-06-16 ENCOUNTER — Encounter: Payer: Self-pay | Admitting: Women's Health

## 2019-06-16 ENCOUNTER — Other Ambulatory Visit: Payer: Self-pay | Admitting: Obstetrics and Gynecology

## 2019-06-16 ENCOUNTER — Ambulatory Visit: Payer: Self-pay | Admitting: Women's Health

## 2019-06-16 ENCOUNTER — Other Ambulatory Visit: Payer: Self-pay

## 2019-06-16 VITALS — BP 124/72 | Temp 97.5°F | Wt 133.0 lb

## 2019-06-16 DIAGNOSIS — N644 Mastodynia: Secondary | ICD-10-CM

## 2019-06-16 DIAGNOSIS — Z1239 Encounter for other screening for malignant neoplasm of breast: Secondary | ICD-10-CM

## 2019-06-16 NOTE — Patient Instructions (Signed)
Breast Self-Awareness Breast self-awareness means being familiar with how your breasts look and feel. It involves checking your breasts regularly and reporting any changes to your health care provider. Practicing breast self-awareness is important. Sometimes changes may not be harmful (are benign), but sometimes a change in your breasts can be a sign of a serious medical problem. It is important to learn how to do this procedure correctly so that you can catch problems early, when treatment is more likely to be successful. All women should practice breast self-awareness, including women who have had breast implants. What you need:  A mirror.  A well-lit room. How to do a breast self-exam A breast self-exam is one way to learn what is normal for your breasts and whether your breasts are changing. To do a breast self-exam: Look for changes  1. Remove all the clothing above your waist. 2. Stand in front of a mirror in a room with good lighting. 3. Put your hands on your hips. 4. Push your hands firmly downward. 5. Compare your breasts in the mirror. Look for differences between them (asymmetry), such as: ? Differences in shape. ? Differences in size. ? Puckers, dips, and bumps in one breast and not the other. 6. Look at each breast for changes in the skin, such as: ? Redness. ? Scaly areas. 7. Look for changes in your nipples, such as: ? Discharge. ? Bleeding. ? Dimpling. ? Redness. ? A change in position. Feel for changes Carefully feel your breasts for lumps and changes. It is best to do this while lying on your back on the floor, and again while sitting or standing in the tub or shower with soapy water on your skin. Feel each breast in the following way: 1. Place the arm on the side of the breast you are examining above your head. 2. Feel your breast with the other hand. 3. Start in the nipple area and make -inch (2 cm) overlapping circles to feel your breast. Use the pads of your  three middle fingers to do this. Apply light pressure, then medium pressure, then firm pressure. The light pressure will allow you to feel the tissue closest to the skin. The medium pressure will allow you to feel the tissue that is a little deeper. The firm pressure will allow you to feel the tissue close to the ribs. 4. Continue the overlapping circles, moving downward over the breast until you feel your ribs below your breast. 5. Move one finger-width toward the center of the body. Continue to use the -inch (2 cm) overlapping circles to feel your breast as you move slowly up toward your collarbone. 6. Continue the up-and-down exam using all three pressures until you reach your armpit.  Write down what you find Writing down what you find can help you remember what to discuss with your health care provider. Write down:  What is normal for each breast.  Any changes that you find in each breast, including: ? The kind of changes you find. ? Any pain or tenderness. ? Size and location of any lumps.  Where you are in your menstrual cycle, if you are still menstruating. General tips and recommendations  Examine your breasts every month.  If you are breastfeeding, the best time to examine your breasts is after a feeding or after using a breast pump.  If you menstruate, the best time to examine your breasts is 5-7 days after your period. Breasts are generally lumpier during menstrual periods, and it may  be more difficult to notice changes.  With time and practice, you will become more familiar with the variations in your breasts and more comfortable with the exam. Contact a health care provider if you:  See a change in the shape or size of your breasts or nipples.  See a change in the skin of your breast or nipples, such as a reddened or scaly area.  Have unusual discharge from your nipples.  Find a lump or thick area that was not there before.  Have pain in your breasts.  Have any  concerns related to your breast health. Summary  Breast self-awareness includes looking for physical changes in your breasts, as well as feeling for any changes within your breasts.  Breast self-awareness should be performed in front of a mirror in a well-lit room.  You should examine your breasts every month. If you menstruate, the best time to examine your breasts is 5-7 days after your menstrual period.  Let your health care provider know of any changes you notice in your breasts, including changes in size, changes on the skin, pain or tenderness, or unusual fluid from your nipples. This information is not intended to replace advice given to you by your health care provider. Make sure you discuss any questions you have with your health care provider. Document Revised: 09/24/2017 Document Reviewed: 09/24/2017 Elsevier Patient Education  2020 Elsevier Inc.  

## 2019-06-16 NOTE — Progress Notes (Signed)
[name] is a 49 y.o. female who presents to [facility] clinic today with no complaints.  Patient is here for a 65-month follow-up on a right breast mass, but denies any pain, drainage or lumps.   Pap Smear: Pap not smear completed today. Last Pap smear was 10/2016 at [facility] and was normal. Per patient has no history of an abnormal Pap smear. Last Pap smear result is available in Epic.   Physical exam: Breasts Breasts symmetrical. No skin abnormalities bilateral breasts with the exception of a small skin tag on the left nipple that patient endorses has been present for 10+ years without change, itching, bleeding,discharge. No nipple retraction bilateral breasts. No nipple discharge bilateral breasts. No lymphadenopathy. No lumps palpated bilateral breasts.       Pelvic/Bimanual Pap is not indicated today    Smoking History: Patient has never smoked not referred to quit line.    Patient Navigation: Patient education provided. Access to services provided for patient through [facility] program. No interpreter provided. No transportation provided   Colorectal Cancer Screening: Per patient has never had colonoscopy completed No complaints today.    Breast and Cervical Cancer Risk Assessment: Patient does not have family history of breast cancer, known genetic mutations, or radiation treatment to the chest before age 45. Patient does not have history of cervical dysplasia, immunocompromised, or DES exposure in-utero.  Risk Assessment    Risk Scores      06/16/2019 04/17/2018   Last edited by: Demetrius Revel, LPN Rolena Infante H, LPN   5-year risk: 1 % 0.9 %   Lifetime risk: 8.9 % 9.2 %          A: BCCCP exam without pap smear Complaint of none.  P: Referred patient to the [facility] for a right breast ultrasound. Appointment scheduled 06/16/2019.  Anglia Blakley, Gerrie Nordmann, NP 06/16/2019 10:50 AM

## 2020-01-06 ENCOUNTER — Telehealth: Payer: Self-pay | Admitting: *Deleted

## 2020-01-06 NOTE — Telephone Encounter (Signed)
Pt left VM message stating that she had spoken with a nurse yesterday regarding breast tenderness and was supposed to receive a call back. Per chart review, I did not see any notes from nursing staff in this office.

## 2020-01-06 NOTE — Telephone Encounter (Signed)
Returned patients call.   Patient reports breast tenderness to both breasts near the nipple. She reports she notes a heaviness to the breast upon awakening. She feels the pain is inside the tissue and has been happening for a few weeks. She reports pain has improved since starting but noted with hugging. No other activity bothers her.   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   She denies she is pregnant. She does not have a cycle as she has had a partial hysterectomy in 2018 put still has her ovaries and cervix.   She did not have breast tenderness previously with her cycles.   She has no redness to the breast, no lumps and no drainage from nipples.   Reviewed with Maye Hides, CNM.   Reviewed with patient that it is mosly likely hormonal fluctuations and that she can come in for a visit with a provider or wait a few weeks and reassess. Patient would like to wait a few weeks and call back for appt if not improved.   She is to follow up in 2022 for Pap Smear. Patient to call with any questions or concerns as needed.

## 2022-05-18 ENCOUNTER — Other Ambulatory Visit: Payer: Self-pay | Admitting: Physician Assistant

## 2022-05-18 DIAGNOSIS — Z1231 Encounter for screening mammogram for malignant neoplasm of breast: Secondary | ICD-10-CM

## 2022-06-13 ENCOUNTER — Ambulatory Visit
Admission: RE | Admit: 2022-06-13 | Discharge: 2022-06-13 | Disposition: A | Discharge status: Home / Self Care | Payer: BC Managed Care – PPO | Source: Ambulatory Visit | Attending: Physician Assistant | Admitting: Physician Assistant

## 2022-06-13 DIAGNOSIS — Z1231 Encounter for screening mammogram for malignant neoplasm of breast: Secondary | ICD-10-CM

## 2023-10-02 ENCOUNTER — Other Ambulatory Visit: Payer: Self-pay | Admitting: Physician Assistant

## 2023-10-02 DIAGNOSIS — Z1231 Encounter for screening mammogram for malignant neoplasm of breast: Secondary | ICD-10-CM

## 2023-10-09 ENCOUNTER — Ambulatory Visit
Admission: RE | Admit: 2023-10-09 | Discharge: 2023-10-09 | Disposition: A | Discharge status: Home / Self Care | Payer: Self-pay | Source: Ambulatory Visit | Attending: Physician Assistant | Admitting: Physician Assistant

## 2023-10-09 DIAGNOSIS — Z1231 Encounter for screening mammogram for malignant neoplasm of breast: Secondary | ICD-10-CM

## 2023-10-18 ENCOUNTER — Ambulatory Visit: Payer: Self-pay
